# Patient Record
Sex: Female | Born: 1955 | Race: Black or African American | Hispanic: No | Marital: Married | Smoking: Never smoker
Health system: Southern US, Community
[De-identification: ages and names within clinical notes are randomized; demographics above are authoritative.]

## PROBLEM LIST (undated history)

## (undated) DIAGNOSIS — I10 Essential (primary) hypertension: Secondary | ICD-10-CM

## (undated) DIAGNOSIS — M069 Rheumatoid arthritis, unspecified: Secondary | ICD-10-CM

## (undated) DIAGNOSIS — K219 Gastro-esophageal reflux disease without esophagitis: Secondary | ICD-10-CM

## (undated) HISTORY — DX: Rheumatoid arthritis, unspecified: M06.9

## (undated) HISTORY — PX: CHOLECYSTECTOMY: SHX55

## (undated) HISTORY — PX: HEMORROIDECTOMY: SUR656

---

## 2010-12-28 ENCOUNTER — Ambulatory Visit: Payer: Self-pay | Admitting: General Practice

## 2018-12-23 ENCOUNTER — Other Ambulatory Visit: Payer: Self-pay | Admitting: Rheumatology

## 2018-12-23 DIAGNOSIS — G8929 Other chronic pain: Secondary | ICD-10-CM

## 2019-01-06 ENCOUNTER — Ambulatory Visit: Payer: BC Managed Care – PPO

## 2019-02-09 ENCOUNTER — Other Ambulatory Visit (HOSPITAL_COMMUNITY): Payer: Self-pay | Admitting: Rheumatology

## 2019-02-09 ENCOUNTER — Other Ambulatory Visit: Payer: Self-pay | Admitting: Rheumatology

## 2019-02-09 DIAGNOSIS — R9389 Abnormal findings on diagnostic imaging of other specified body structures: Secondary | ICD-10-CM

## 2019-02-12 ENCOUNTER — Other Ambulatory Visit: Payer: Self-pay

## 2019-02-12 ENCOUNTER — Ambulatory Visit
Admission: RE | Admit: 2019-02-12 | Discharge: 2019-02-12 | Disposition: A | Discharge status: Home / Self Care | Payer: BC Managed Care – PPO | Source: Ambulatory Visit | Attending: Rheumatology | Admitting: Rheumatology

## 2019-02-12 ENCOUNTER — Other Ambulatory Visit: Payer: Self-pay | Admitting: Rheumatology

## 2019-02-12 DIAGNOSIS — N281 Cyst of kidney, acquired: Secondary | ICD-10-CM | POA: Diagnosis present

## 2019-02-12 DIAGNOSIS — R9389 Abnormal findings on diagnostic imaging of other specified body structures: Secondary | ICD-10-CM | POA: Diagnosis not present

## 2019-11-30 ENCOUNTER — Ambulatory Visit
Admission: RE | Admit: 2019-11-30 | Discharge: 2019-11-30 | Disposition: A | Discharge status: Home / Self Care | Payer: 59 | Source: Ambulatory Visit | Attending: Family Medicine | Admitting: Family Medicine

## 2019-11-30 ENCOUNTER — Other Ambulatory Visit: Payer: Self-pay | Admitting: Family Medicine

## 2019-11-30 DIAGNOSIS — M25512 Pain in left shoulder: Secondary | ICD-10-CM

## 2019-11-30 DIAGNOSIS — M25532 Pain in left wrist: Secondary | ICD-10-CM | POA: Diagnosis present

## 2021-03-26 ENCOUNTER — Emergency Department (HOSPITAL_COMMUNITY): Payer: 59

## 2021-03-26 ENCOUNTER — Other Ambulatory Visit: Payer: Self-pay

## 2021-03-26 ENCOUNTER — Emergency Department (HOSPITAL_COMMUNITY)
Admission: EM | Admit: 2021-03-26 | Discharge: 2021-03-27 | Disposition: A | Discharge status: Home / Self Care | Payer: 59 | Attending: Emergency Medicine | Admitting: Emergency Medicine

## 2021-03-26 ENCOUNTER — Encounter (HOSPITAL_COMMUNITY): Payer: Self-pay

## 2021-03-26 DIAGNOSIS — M545 Low back pain, unspecified: Secondary | ICD-10-CM | POA: Insufficient documentation

## 2021-03-26 DIAGNOSIS — M25552 Pain in left hip: Secondary | ICD-10-CM | POA: Insufficient documentation

## 2021-03-26 DIAGNOSIS — I10 Essential (primary) hypertension: Secondary | ICD-10-CM | POA: Diagnosis not present

## 2021-03-26 DIAGNOSIS — M25559 Pain in unspecified hip: Secondary | ICD-10-CM

## 2021-03-26 HISTORY — DX: Essential (primary) hypertension: I10

## 2021-03-26 LAB — URINALYSIS, ROUTINE W REFLEX MICROSCOPIC
Bilirubin Urine: NEGATIVE
Glucose, UA: NEGATIVE mg/dL
Hgb urine dipstick: NEGATIVE
Ketones, ur: NEGATIVE mg/dL
Leukocytes,Ua: NEGATIVE
Nitrite: NEGATIVE
Protein, ur: NEGATIVE mg/dL
Specific Gravity, Urine: 1.008 (ref 1.005–1.030)
pH: 7 (ref 5.0–8.0)

## 2021-03-26 MED ORDER — METHOCARBAMOL 500 MG PO TABS: 500.0000 mg | ORAL_TABLET | Freq: Three times a day (TID) | ORAL | 0 refills | Status: DC | PRN

## 2021-03-26 MED ORDER — DEXAMETHASONE SODIUM PHOSPHATE 10 MG/ML IJ SOLN
10.0000 mg | Freq: Once | INTRAMUSCULAR | Status: AC
  Administered 2021-03-26: 10 mg via INTRAMUSCULAR
  Filled 2021-03-26: qty 1

## 2021-03-26 MED ORDER — LIDOCAINE 5 % EX PTCH: 1.0000 | MEDICATED_PATCH | CUTANEOUS | 0 refills | Status: DC

## 2021-03-26 NOTE — Discharge Instructions (Signed)
You were evaluated in the Emergency Department and after careful evaluation, we did not find any emergent condition requiring admission or further testing in the hospital.  Your exam/testing today was overall reassuring.  X-ray was normal and urine sample was normal.  We are providing you with a prescription for a different muscle relaxer to see if it is more helpful.  Also providing numbing patches.  Recommend continued follow-up with your PCP and/or specialist.  Please return to the Emergency Department if you experience any worsening of your condition.  Thank you for allowing [place] to be a part of your care.

## 2021-03-26 NOTE — ED Provider Notes (Signed)
[facility]-EMERGENCY DEPT [facility] Emergency Department Provider Note MRN:  [id number]  Arrival date & time: 03/26/21     Chief Complaint   Back Pain   History of Present Illness   Alyssa Richardson is a 66 y.o. year-old female with a history of hypertension presenting to the ED with chief complaint of back pain.  3 months of pain to the left hip, sometimes in the left lower back, sometimes radiates down the back of the left leg.  Has had injections, has had a prednisone taper, nothing seems to help.  Celecoxib helps but she is not allowed to take it very often because she has a history of GI bleeding.  Denies fever, no numbness or weakness to the arms or legs, no bowel or bladder dysfunction.  Review of Systems  A thorough review of systems was obtained and all systems are negative except as noted in the HPI and PMH.   Patient's Health History    Past Medical History:  Diagnosis Date   Hypertension     Past Surgical History:  Procedure Laterality Date   CHOLECYSTECTOMY     HEMORROIDECTOMY      History reviewed. No pertinent family history.  Social History   Socioeconomic History   Marital status: Married    Spouse name: Not on file   Number of children: Not on file   Years of education: Not on file   Highest education level: Not on file  Occupational History   Not on file  Tobacco Use   Smoking status: Not on file   Smokeless tobacco: Not on file  Substance and Sexual Activity   Alcohol use: Not on file   Drug use: Not on file   Sexual activity: Not on file  Other Topics Concern   Not on file  Social History Narrative   Not on file   Social Determinants of Health   Financial Resource Strain: Not on file  Food Insecurity: Not on file  Transportation Needs: Not on file  Physical Activity: Not on file  Stress: Not on file  Social Connections: Not on file  Intimate Partner Violence: Not on file     Physical Exam   Vitals:   03/26/21 2106  BP: (!) 224/83   Pulse: 68  Resp: 17  Temp: 97.9 F (36.6 C)  SpO2: 97%    CONSTITUTIONAL: Well-appearing, NAD NEURO:  Alert and oriented x 3, normal and symmetric strength and sensation, normal patellar reflexes bilaterally EYES:  eyes equal and reactive ENT/NECK:  no LAD, no JVD CARDIO:   Regular rate, well-perfused, normal S1 and S2 PULM:  CTAB no wheezing or rhonchi GI/GU:  non-distended, non-tender MSK/SPINE:  No gross deformities, no edema SKIN:  no rash, atraumatic   *Additional and/or pertinent findings included in MDM below  Diagnostic and Interventional Summary    EKG Interpretation  Date/Time:    Ventricular Rate:    PR Interval:    QRS Duration:   QT Interval:    QTC Calculation:   R Axis:     Text Interpretation:         Labs Reviewed  URINALYSIS, ROUTINE W REFLEX MICROSCOPIC - Abnormal; Notable for the following components:      Result Value   Color, Urine STRAW (*)    All other components within normal limits    DG Hip Unilat W or Wo Pelvis 2-3 Views Left  Final Result      Medications  dexamethasone (DECADRON) injection 10 mg (10 mg  Intramuscular Given 03/26/21 2346)     Procedures  /  Critical Care Procedures  ED Course and Medical Decision Making  Initial Impression and Ddx Suspect either sciatica versus osteoarthritis of the hip versus bursitis of the hip versus muscle strain or spasm.  Patient has preserved range of motion of the hip, no increased warmth or fever, nothing to suggest septic joint.  No symptoms to suggest myelopathy.  With 3 months of pain and this is becoming more of a chronic pain issue, providing a dose of Decadron, patient also endorsing intermittent left flank pain that seems musculoskeletal, will obtain screening urinalysis.  Anticipating discharge.  Interpretation of Diagnostics X-rays without evidence of fracture or abnormalities, urinalysis is normal.      Patient Reassessment and Ultimate Disposition/Management Patient is  appropriate for discharge with further symptomatic management at home and follow-up.    Complexity of Problems Addressed Chronic illness with exacerbation  Additional Data Reviewed and Analyzed Further history obtained from: Past medical history and medications listed in the EMR  Patient Encounter Risk Assessment Moderate:  Prescriptions  Elmer Sow. Pilar Plate, MD [facility] Emergency [facility] [email or web address]  Final Clinical Impressions(s) / ED Diagnoses     ICD-10-CM   1. Hip pain  M25.559       ED Discharge Orders          Ordered    methocarbamol (ROBAXIN) 500 MG tablet  Every 8 hours PRN        03/26/21 2349    lidocaine (LIDODERM) 5 %  Every 24 hours        03/26/21 2349             Discharge Instructions Discussed with and Provided to Patient:     Discharge Instructions      You were evaluated in the Emergency Department and after careful evaluation, we did not find any emergent condition requiring admission or further testing in the hospital.  Your exam/testing today was overall reassuring.  X-ray was normal and urine sample was normal.  We are providing you with a prescription for a different muscle relaxer to see if it is more helpful.  Also providing numbing patches.  Recommend continued follow-up with your PCP and/or specialist.  Please return to the Emergency Department if you experience any worsening of your condition.  Thank you for allowing [place] to be a part of your care.        Sabas Sous, MD 03/26/21 2350

## 2021-03-26 NOTE — ED Triage Notes (Signed)
Intermittent Left Hip pain that radiates down left leg to left ankle x 3 months. Worse today.

## 2021-03-26 NOTE — ED Triage Notes (Signed)
BP elevated in triage, denies cp or sob. Pt reports she took her bp meds today.

## 2021-09-13 HISTORY — PX: COLONOSCOPY: SHX174

## 2022-08-14 ENCOUNTER — Emergency Department (HOSPITAL_COMMUNITY)
Admission: EM | Admit: 2022-08-14 | Discharge: 2022-08-14 | Disposition: A | Discharge status: Home / Self Care | Payer: 59 | Attending: Emergency Medicine | Admitting: Emergency Medicine

## 2022-08-14 ENCOUNTER — Other Ambulatory Visit: Payer: Self-pay

## 2022-08-14 ENCOUNTER — Emergency Department (HOSPITAL_COMMUNITY): Payer: 59

## 2022-08-14 ENCOUNTER — Encounter (HOSPITAL_COMMUNITY): Payer: Self-pay | Admitting: *Deleted

## 2022-08-14 DIAGNOSIS — Z7982 Long term (current) use of aspirin: Secondary | ICD-10-CM | POA: Insufficient documentation

## 2022-08-14 DIAGNOSIS — R0602 Shortness of breath: Secondary | ICD-10-CM | POA: Insufficient documentation

## 2022-08-14 DIAGNOSIS — R079 Chest pain, unspecified: Secondary | ICD-10-CM

## 2022-08-14 DIAGNOSIS — R072 Precordial pain: Secondary | ICD-10-CM | POA: Diagnosis present

## 2022-08-14 HISTORY — DX: Gastro-esophageal reflux disease without esophagitis: K21.9

## 2022-08-14 LAB — CBC
HCT: 33.9 % — ABNORMAL LOW (ref 36.0–46.0)
Hemoglobin: 11.4 g/dL — ABNORMAL LOW (ref 12.0–15.0)
MCH: 26.5 pg (ref 26.0–34.0)
MCHC: 33.6 g/dL (ref 30.0–36.0)
MCV: 78.7 fL — ABNORMAL LOW (ref 80.0–100.0)
Platelets: 257 10*3/uL (ref 150–400)
RBC: 4.31 MIL/uL (ref 3.87–5.11)
RDW: 14.7 % (ref 11.5–15.5)
WBC: 7.8 10*3/uL (ref 4.0–10.5)
nRBC: 0 % (ref 0.0–0.2)

## 2022-08-14 LAB — BASIC METABOLIC PANEL
Anion gap: 9 (ref 5–15)
BUN: 22 mg/dL (ref 8–23)
CO2: 28 mmol/L (ref 22–32)
Calcium: 9.2 mg/dL (ref 8.9–10.3)
Chloride: 96 mmol/L — ABNORMAL LOW (ref 98–111)
Creatinine, Ser: 0.84 mg/dL (ref 0.44–1.00)
GFR, Estimated: >60 mL/min (ref >60)
Glucose, Bld: 149 mg/dL — ABNORMAL HIGH (ref 70–99)
Potassium: 3.8 mmol/L (ref 3.5–5.1)
Sodium: 133 mmol/L — ABNORMAL LOW (ref 135–145)

## 2022-08-14 LAB — TROPONIN I (HIGH SENSITIVITY)
Troponin I (High Sensitivity): <2 ng/L (ref <18)
Troponin I (High Sensitivity): <2 ng/L (ref <18)

## 2022-08-14 MED ORDER — PANTOPRAZOLE SODIUM 40 MG PO TBEC
40.0000 mg | DELAYED_RELEASE_TABLET | Freq: Every day | ORAL | 0 refills | Status: DC
Start: 2022-08-14 — End: 2023-08-08

## 2022-08-14 MED ORDER — ASPIRIN 81 MG PO CHEW: 81.0000 mg | CHEWABLE_TABLET | Freq: Every day | ORAL | 0 refills | Status: DC

## 2022-08-14 NOTE — Discharge Instructions (Signed)
Follow up with cardiology regarding your chest pain.  Back to the ER for any new or worsening symptoms.  Start taking the Protonix and aspirin daily.

## 2022-08-14 NOTE — ED Triage Notes (Signed)
Pt c/o left side chest pain that has been intermittent for a couple of weeks but states last night the pain lasted for "a while"  Pt states she had an episode of dizziness with the chest pain and describes it as a squeezing pain

## 2022-08-14 NOTE — ED Provider Notes (Signed)
[facility] EMERGENCY DEPARTMENT AT [facility] Provider Note   CSN: [id number] Arrival date & time: 08/14/22  0940     History  Chief Complaint  Patient presents with   Chest Pain    Alyssa Richardson is a 67 y.o. female. Presents the ER complaining of squeezing chest pain yesterday and last night.  States it happens several times throughout the day.  She had episode last night that lasted about an hour.  Is nonradiating.  It was associated with some mild shortness of breath, feeling faint and sweating.  States she was sitting down in her chair having chest pain at that time.  Stood up from her chair and that is when she felt faint and got a little bit sweaty.  This resolved when she sat back down.  No other episodes of dizziness or sweating.  Did not have any nausea with it.    Had no pain since that time.  She has history of high blood pressure, borderline diabetes and has been told that her cholesterol is borderline high, she supposed to control this with her diet. Patient is not a smoker, no drug use.   Chest Pain Pain location:  Substernal area Pain radiates to:  Does not radiate Pain severity:  No pain      Home Medications Prior to Admission medications   Medication Sig Start Date End Date Taking? Authorizing Provider  aspirin 81 MG chewable tablet Chew 1 tablet (81 mg total) by mouth daily. 08/14/22  Yes Jaleena Viviani A, PA-C  pantoprazole (PROTONIX) 40 MG tablet Take 1 tablet (40 mg total) by mouth daily. 08/14/22  Yes Stoney Karczewski A, PA-C  lidocaine (LIDODERM) 5 % Place 1 patch onto the skin daily. Remove & Discard patch within 12 hours or as directed by MD 03/26/21   Sabas Sous, MD  methocarbamol (ROBAXIN) 500 MG tablet Take 1 tablet (500 mg total) by mouth every 8 (eight) hours as needed for muscle spasms. 03/26/21   Sabas Sous, MD      Allergies    Diclofenac sodium, Aspirin, Caffeine, and Ibuprofen    Review of Systems   Review of Systems   Cardiovascular:  Positive for chest pain.    Physical Exam Updated Vital Signs BP (!) 153/67   Pulse (!) 58   Temp 97.8 F (36.6 C) (Oral)   Resp 17   Ht 5\' 1"  (1.549 m)   Wt 85.7 kg   SpO2 98%   BMI 35.71 kg/m  Physical Exam Vitals and nursing note reviewed.  Constitutional:      General: She is not in acute distress.    Appearance: She is well-developed.  HENT:     Head: Normocephalic and atraumatic.  Eyes:     Extraocular Movements: Extraocular movements intact.     Conjunctiva/sclera: Conjunctivae normal.     Pupils: Pupils are equal, round, and reactive to light.  Cardiovascular:     Rate and Rhythm: Normal rate and regular rhythm.     Heart sounds: No murmur heard. Pulmonary:     Effort: Pulmonary effort is normal. No respiratory distress.     Breath sounds: Normal breath sounds.  Abdominal:     Palpations: Abdomen is soft.     Tenderness: There is no abdominal tenderness.  Musculoskeletal:        General: No swelling. Normal range of motion.     Cervical back: Neck supple.     Right lower leg: No tenderness. No edema.  Left lower leg: No tenderness. No edema.  Skin:    General: Skin is warm and dry.     Capillary Refill: Capillary refill takes less than 2 seconds.  Neurological:     General: No focal deficit present.     Mental Status: She is alert.  Psychiatric:        Mood and Affect: Mood normal.     ED Results / Procedures / Treatments   Labs (all labs ordered are listed, but only abnormal results are displayed) Labs Reviewed  BASIC METABOLIC PANEL - Abnormal; Notable for the following components:      Result Value   Sodium 133 (*)    Chloride 96 (*)    Glucose, Bld 149 (*)    All other components within normal limits  CBC - Abnormal; Notable for the following components:   Hemoglobin 11.4 (*)    HCT 33.9 (*)    MCV 78.7 (*)    All other components within normal limits  TROPONIN I (HIGH SENSITIVITY)  TROPONIN I (HIGH SENSITIVITY)     EKG None  Radiology DG Chest Port 1 View  Result Date: 08/14/2022 CLINICAL DATA:  Chest pain. EXAM: PORTABLE CHEST 1 VIEW COMPARISON:  None Available. FINDINGS: The heart size and mediastinal contours are within normal limits. Both lungs are clear. The visualized skeletal structures are unremarkable. IMPRESSION: No active disease. Electronically Signed   By: Lupita Raider M.D.   On: 08/14/2022 10:30    Procedures Procedures    Medications Ordered in ED Medications - No data to display  ED Course/ Medical Decision Making/ A&P                             Medical Decision Making The patient presented today for chest pain that had started yesterday and has now resolved. EKG showed normal sinus rhythm, no ischemic changes, Chest Xray was independently reviewed by me and shows no pulmonary edema or infiltrates. I agree with radiology interpretation.   Symptoms were not present in the ED  I considered a broad differential including but not limited to ACS, PE, Dissection, pneumothorax, costochondritis, pneumonia, GERD, pericarditis, and pericardial effusion.  PE is considered risk, patient does not have any current pain, pain is not pleuritic, no tachycardia or tachypnea, no shortness of breath or pleuritic pain.  Do not feel further testing is needed.  I feel disssection is really unlikely.  Do not feel further testing is needed for this.  Symptoms and EKG are not consistent with pericarditis  Their heart score is 4. Troponins show delta of 0, troponin is <2  Consult to cardiology, spoke with Dr. Tenny Craw.  She feels comfortable with the patient going home and follow-up as an outpatient.  Suggested starting Protonix and aspirin due to risk factors. Is agreeable plan of care and discharge.  Amount and/or Complexity of Data Reviewed Labs: ordered. Radiology: ordered.           Final Clinical Impression(s) / ED Diagnoses Final diagnoses:  Chest pain, unspecified type     Rx / DC Orders ED Discharge Orders          Ordered    Ambulatory referral to Cardiology       Comments: If you have not heard from the Cardiology office within the next 72 hours please call [phone].   08/14/22 1424    pantoprazole (PROTONIX) 40 MG tablet  Daily  08/14/22 1425    aspirin 81 MG chewable tablet  Daily        08/14/22 [address] 08/14/22 1431    Bethann Berkshire, MD 08/15/22 1758

## 2022-09-06 ENCOUNTER — Ambulatory Visit: Payer: 59 | Attending: Internal Medicine | Admitting: Internal Medicine

## 2022-09-06 ENCOUNTER — Encounter: Payer: Self-pay | Admitting: Internal Medicine

## 2022-09-06 VITALS — BP 160/80 | HR 68 | Ht 61.0 in | Wt 190.8 lb

## 2022-09-06 DIAGNOSIS — I10 Essential (primary) hypertension: Secondary | ICD-10-CM | POA: Insufficient documentation

## 2022-09-06 DIAGNOSIS — R0789 Other chest pain: Secondary | ICD-10-CM | POA: Insufficient documentation

## 2022-09-06 MED ORDER — LISINOPRIL-HYDROCHLOROTHIAZIDE 20-12.5 MG PO TABS: 2.0000 | ORAL_TABLET | Freq: Every day | ORAL | 11 refills | Status: DC

## 2022-09-06 NOTE — Progress Notes (Signed)
Cardiology Office Note  Date: 09/06/2022   ID: Alyssa Richardson, DOB April 22, 1955, MRN [id number]  PCP:  [facility]  Cardiologist:  Marjo Bicker, MD Electrophysiologist:  None   Reason for Office Visit: Evaluation of chest pressure at the request of Dr. Estell Harpin   History of Present Illness: Alyssa Richardson is a 67 y.o. female known to have HTN was referred to cardiology clinic for evaluation of chest pressure.  Patient had sporadic episodes of chest pressure since last year lasting for 1 minute but on the recent ER visit in 07/2022, the chest pressure lasted for the whole day, intermittently. EKG showed no ischemia and troponins were within normal limits. No relation with rest or exercise.  Her blood pressures at home are not controlled, ranges between 150 and 160 mmHg. She does not check blood pressures daily at home.  Denies DOE, orthopnea, dizziness, syncope.  Denies smoking cigarettes, alcohol use and illicit drug abuse.  No family history of premature CAD/CAD.  Past Medical History:  Diagnosis Date   Acid reflux    Arthritis, rheumatoid (HCC)    Hypertension     Past Surgical History:  Procedure Laterality Date   CHOLECYSTECTOMY     HEMORROIDECTOMY      Current Outpatient Medications  Medication Sig Dispense Refill   aspirin 81 MG chewable tablet Chew 1 tablet (81 mg total) by mouth daily. 30 tablet 0   lidocaine (LIDODERM) 5 % Place 1 patch onto the skin daily. Remove & Discard patch within 12 hours or as directed by MD 5 patch 0   lisinopril-hydrochlorothiazide (ZESTORETIC) 20-12.5 MG tablet Take 2 tablets by mouth daily. 60 tablet 11   methocarbamol (ROBAXIN) 500 MG tablet Take 1 tablet (500 mg total) by mouth every 8 (eight) hours as needed for muscle spasms. 30 tablet 0   metoprolol succinate (TOPROL-XL) 50 MG 24 hr tablet Take 50 mg by mouth 2 (two) times daily.     pantoprazole (PROTONIX) 40 MG tablet Take 1 tablet (40 mg total) by mouth  daily. 30 tablet 0   No current facility-administered medications for this visit.   Allergies:  Diclofenac sodium, Aspirin, Caffeine, and Ibuprofen   Social History: The patient  reports that she has never smoked. She has never used smokeless tobacco. She reports that she does not currently use alcohol. She reports that she does not currently use drugs.   Family History: The patient's family history is not on file.   ROS:  Please see the history of present illness. Otherwise, complete review of systems is positive for none.  All other systems are reviewed and negative.   Physical Exam: VS:  BP (!) 160/80   Pulse 68   Ht 5\' 1"  (1.549 m)   Wt 190 lb 12.8 oz (86.5 kg)   SpO2 99%   BMI 36.05 kg/m , BMI Body mass index is 36.05 kg/m.  Wt Readings from Last 3 Encounters:  09/06/22 190 lb 12.8 oz (86.5 kg)  08/14/22 189 lb (85.7 kg)  03/26/21 180 lb (81.6 kg)    General: Patient appears comfortable at rest. HEENT: Conjunctiva and lids normal, oropharynx clear with moist mucosa. Neck: Supple, no elevated JVP or carotid bruits, no thyromegaly. Lungs: Clear to auscultation, nonlabored breathing at rest. Cardiac: Regular rate and rhythm, no S3 or significant systolic murmur, no pericardial rub. Abdomen: Soft, nontender, no hepatomegaly, bowel sounds present, no guarding or rebound. Extremities: No pitting edema, distal pulses 2+. Skin: Warm and dry.  Musculoskeletal: No kyphosis. Neuropsychiatric: Alert and oriented x3, affect grossly appropriate.  Recent Labwork: 08/14/2022: BUN 22; Creatinine, Ser 0.84; Hemoglobin 11.4; Platelets 257; Potassium 3.8; Sodium 133  No results found for: "CHOL", "TRIG", "HDL", "CHOLHDL", "VLDL", "LDLCALC", "LDLDIRECT"   Assessment and Plan: Patient is a 67 year old F known to have HTN was referred to cardiology clinic for evaluation of chest pressure.  # Sporadic episodes of chest pressure likely secondary to poorly controlled HTN # Poorly controlled  HTN -Patient's blood pressure at home ranges between 150 and 160 mmHg when she checks randomly. Instructed her to check BPs twice a day, in a.m. and p.m.  Increase lisinopril-HCTZ from 20-12.5 mg to 40-25 mg once daily. Pharmacy does not have 40-25 mg, hence she will be taking 20-12.5 mg 2 tablets daily.  Continue metoprolol succinate 50 mg (will need to switch to carvedilol if blood pressures are inadequately controlled).  If she continue to have chest pressures despite adequate control of blood pressures, she will benefit from NM stress test/exercise Myoview.   I have spent a total of 30 minutes with patient reviewing chart, EKGs, labs and examining patient as well as establishing an assessment and plan that was discussed with the patient.  > 50% of time was spent in direct patient care.    Medication Adjustments/Labs and Tests Ordered: Current medicines are reviewed at length with the patient today.  Concerns regarding medicines are outlined above.   Tests Ordered: No orders of the defined types were placed in this encounter.   Medication Changes: Meds ordered this encounter  Medications   lisinopril-hydrochlorothiazide (ZESTORETIC) 20-12.5 MG tablet    Sig: Take 2 tablets by mouth daily.    Dispense:  60 tablet    Refill:  11    Disposition:  Follow up  6 months  Signed, [name] Verne Spurr, MD, 09/06/2022 12:40 PM     [facility] [address] [place], [place] [address]

## 2022-09-06 NOTE — Patient Instructions (Signed)
Medication Instructions:   Increase Lisinopril -hydrochlorothiazide to 40-25 mg daily   *If you need a refill on your cardiac medications before your next appointment, please call your pharmacy*   Lab Work: NONE   If you have labs (blood work) drawn today and your tests are completely normal, you will receive your results only by: MyChart Message (if you have MyChart) OR A paper copy in the mail If you have any lab test that is abnormal or we need to change your treatment, we will call you to review the results.   Testing/Procedures: NONE    Follow-Up: At [facility], you and your health needs are our priority.  As part of our continuing mission to provide you with exceptional heart care, we have created designated Provider Care Teams.  These Care Teams include your primary Cardiologist (physician) and Advanced Practice Providers (APPs -  Physician Assistants and Nurse Practitioners) who all work together to provide you with the care you need, when you need it.  We recommend signing up for the patient portal called "MyChart".  Sign up information is provided on this After Visit Summary.  MyChart is used to connect with patients for Virtual Visits (Telemedicine).  Patients are able to view lab/test results, encounter notes, upcoming appointments, etc.  Non-urgent messages can be sent to your provider as well.   To learn more about what you can do with MyChart, go to [email or web address].    Your next appointment:   6 month(s)  Provider:   Luane School, MD    Other Instructions Thank you for choosing [facility]!

## 2022-10-01 ENCOUNTER — Encounter: Payer: Self-pay | Admitting: Internal Medicine

## 2022-10-02 ENCOUNTER — Other Ambulatory Visit: Payer: Self-pay | Admitting: *Deleted

## 2022-10-02 MED ORDER — LISINOPRIL-HYDROCHLOROTHIAZIDE 20-12.5 MG PO TABS: 2.0000 | ORAL_TABLET | Freq: Every day | ORAL | 1 refills | Status: AC

## 2022-10-24 ENCOUNTER — Ambulatory Visit: Payer: 59 | Admitting: Internal Medicine

## 2023-02-26 IMAGING — DX DG HIP (WITH OR WITHOUT PELVIS) 2-3V*L*
3 series · 3 of 3 positions shown · non-contrast
Comparison: None.

CLINICAL DATA: Left hip pain that radiates down the left leg.

EXAM:
DG HIP (WITH OR WITHOUT PELVIS) 2-3V LEFT

[pelvis ap]
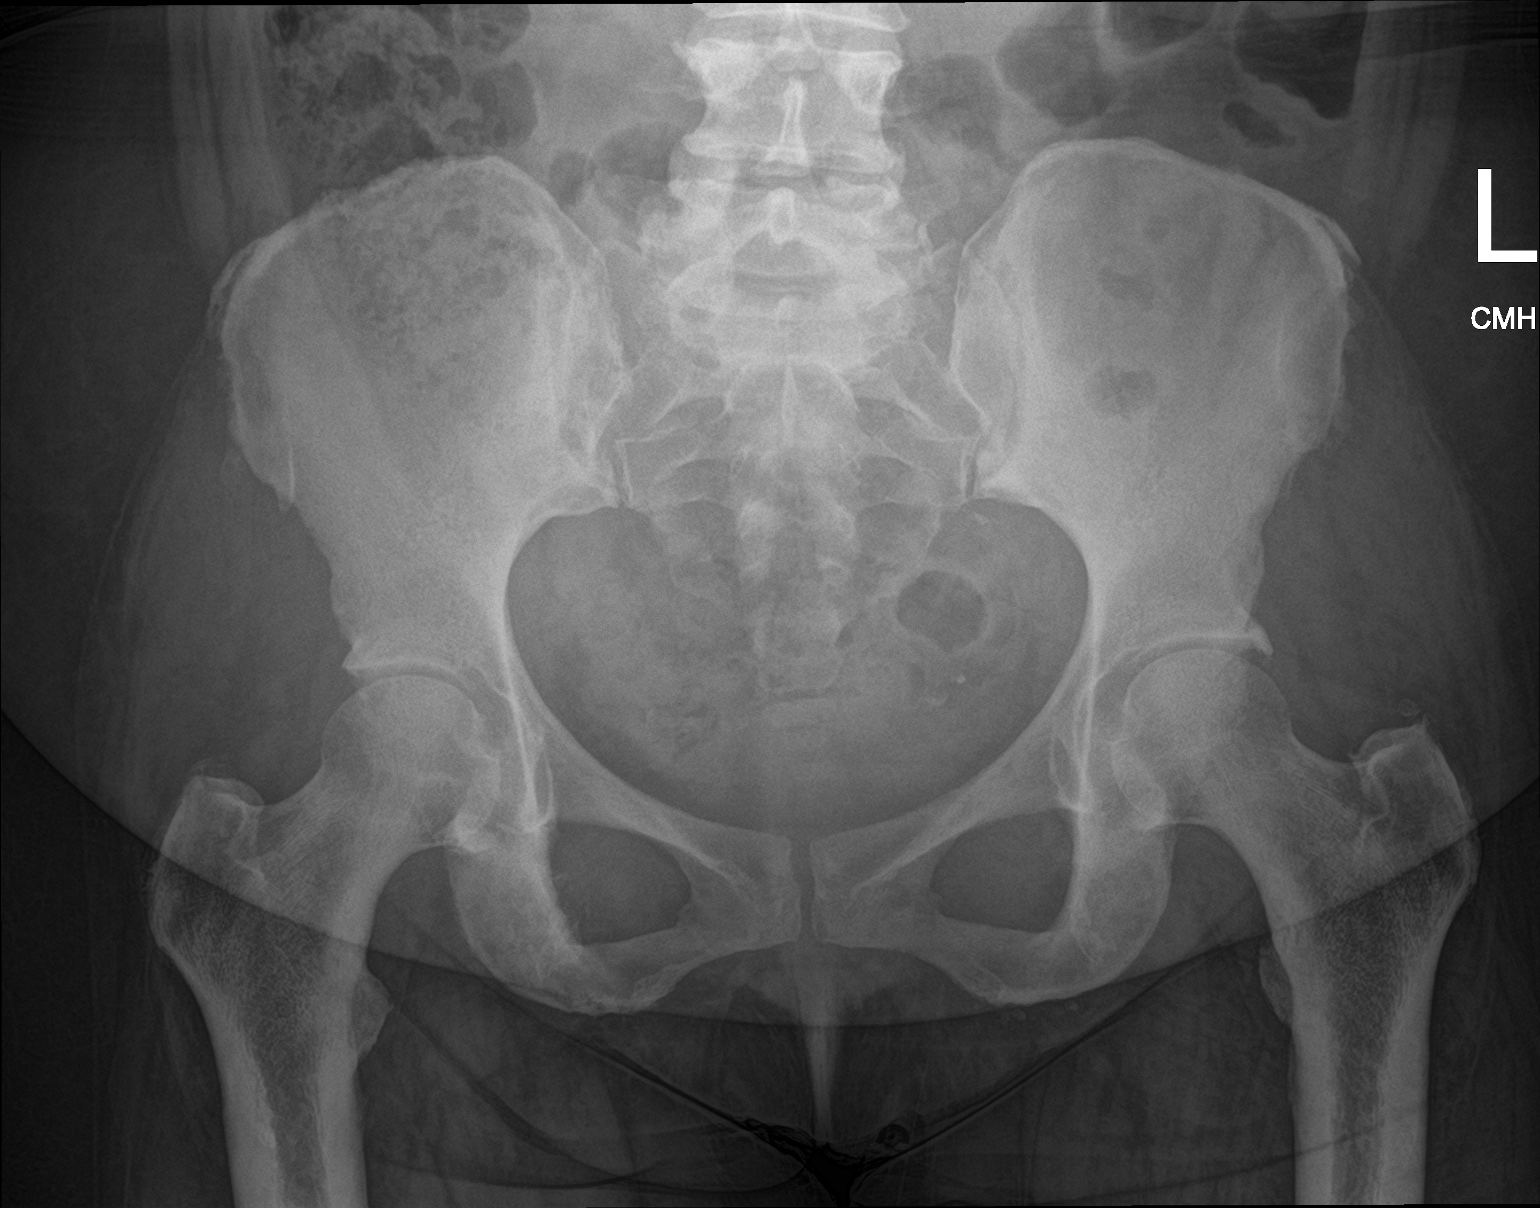

[hip ap]
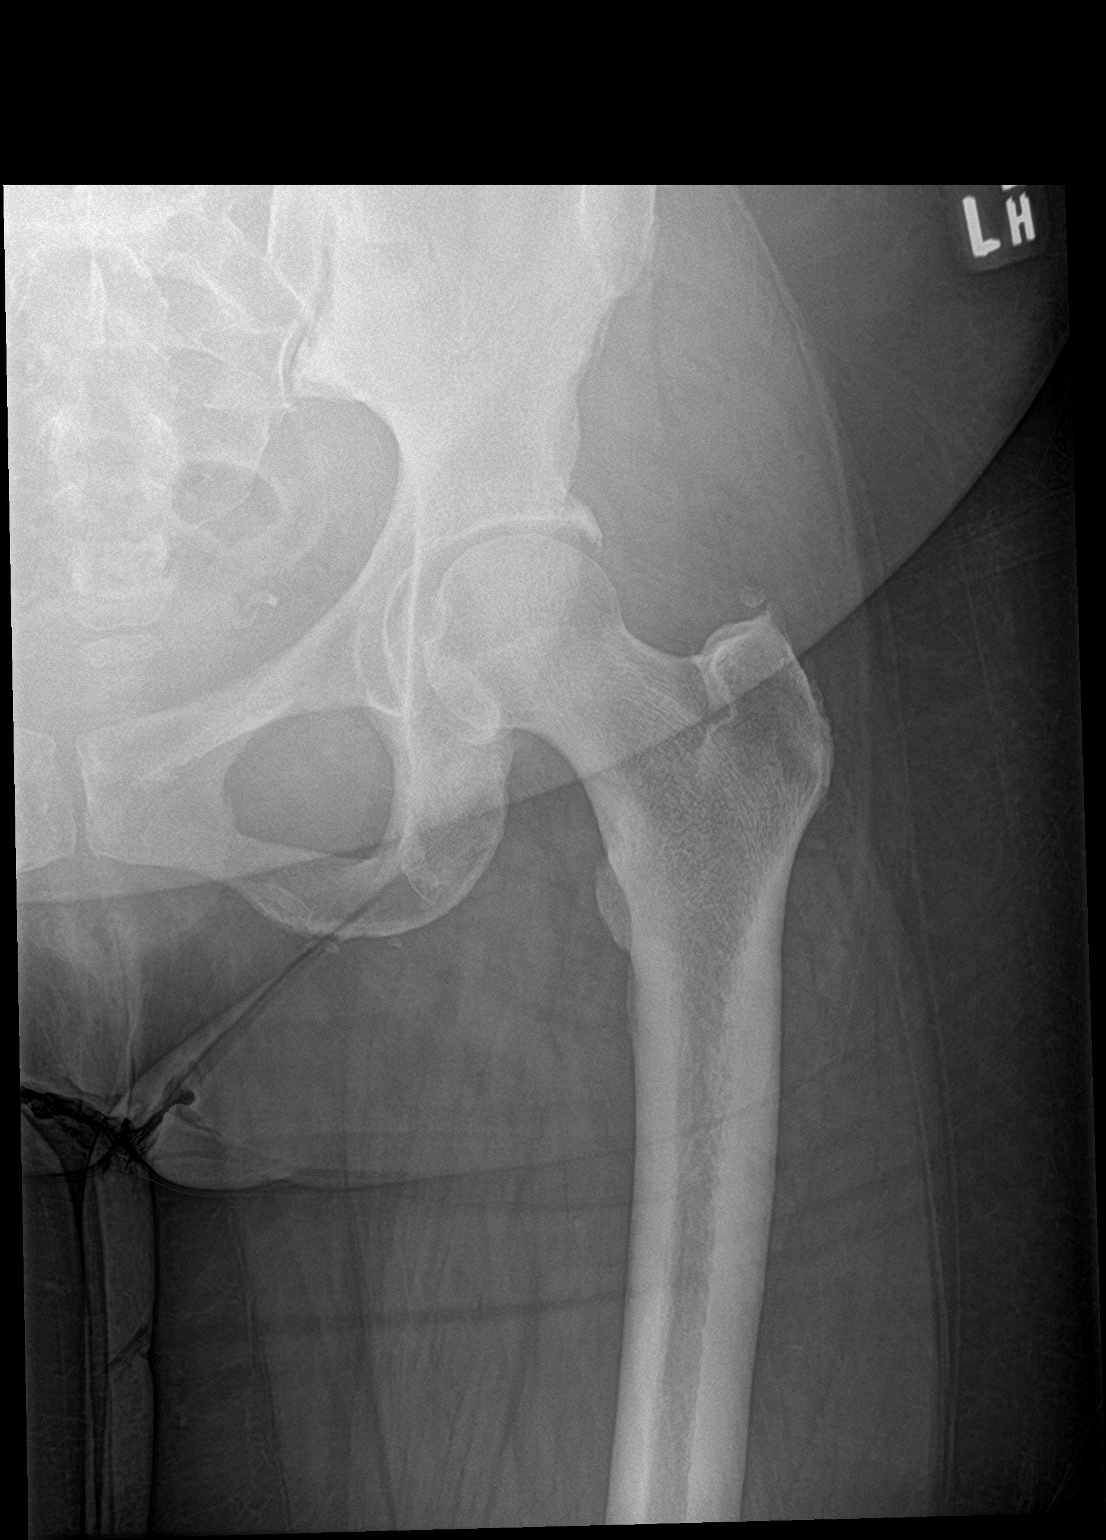

[hip lat]
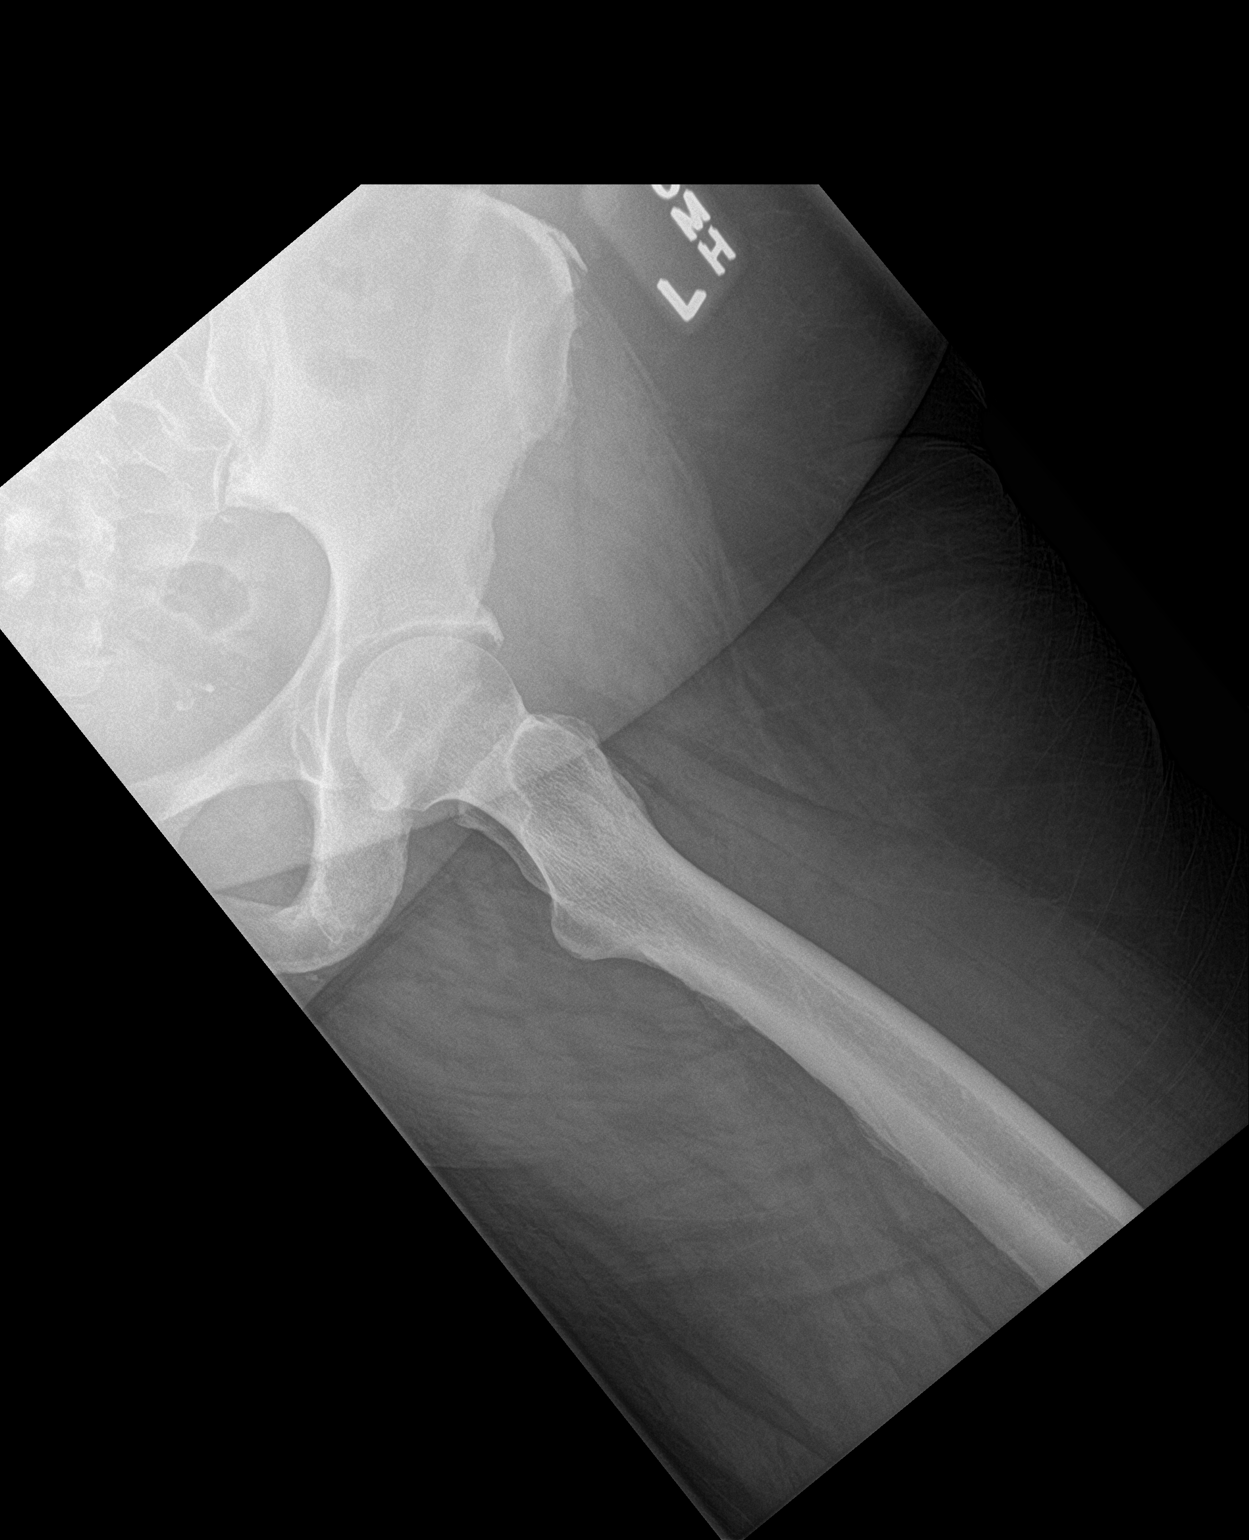

[3 of 3 positions shown; findings below may reference images not displayed]

FINDINGS: There is no evidence of hip fracture or dislocation. There is no
evidence of arthropathy or other focal bone abnormality.
IMPRESSION: Negative.

## 2023-03-13 ENCOUNTER — Ambulatory Visit: Payer: 59 | Admitting: Internal Medicine

## 2023-04-30 ENCOUNTER — Ambulatory Visit: Payer: 59 | Admitting: Student

## 2023-06-25 ENCOUNTER — Ambulatory Visit: Payer: 59 | Admitting: Physician Assistant

## 2023-06-25 NOTE — Progress Notes (Deleted)
  Cardiology Office Note:  .   Date:  06/25/2023  ID:  Alyssa Richardson, DOB Sep 14, 1955, MRN [id number] PCP: [facility]  [facility] Providers Cardiologist:  Marjo Bicker, MD { Click to update primary MD,subspecialty MD or APP then REFRESH:1}   History of Present Illness: .   Alyssa Richardson is a 68 y.o. female with PMH of HTN, GERD, and RA who present for 6 m/o follow up. During hospitalization in 07/2022, patient reports chest pain that was determined to be non-cardiac in the setting of negative troponin's, no ischemic changes and no associated symptoms. No further work up at that time.  She last seen in office 09/06/2022 with Dr. Jenene Slicker to establish care and evaluation of chest pressure following recent hospitalization. Per chart review, BP in office 160/80 and noted poorly controlled home BP with SBP 150-160's. Increased dosage of Lisinopril- hydrochlorothiazide 20-12.5 mg to BID and continued metoprolol succinate 50 mg. Denies any cardiac symptoms at this visit.   ROS: ***  Studies Reviewed: .       @MUSEEKG @  EKG 08/14/22 reviewed: NSR, HR 63, QT prolongation 486 ms, nonspecific T wave changes Labs reviewed from [facility]:   *** Risk Assessment/Calculations:   {Does this patient have ATRIAL FIBRILLATION?:[phone]} No BP recorded.  {Refresh Note OR Click here to enter BP  :1}***       Physical Exam:   VS:  There were no vitals taken for this visit.   Wt Readings from Last 3 Encounters:  09/06/22 190 lb 12.8 oz (86.5 kg)  08/14/22 189 lb (85.7 kg)  03/26/21 180 lb (81.6 kg)    GEN: Well nourished, well developed in no acute distress NECK: No JVD; No carotid bruits CARDIAC: ***RRR, no murmurs, rubs, gallops RESPIRATORY:  Clear to auscultation without rales, wheezing or rhonchi  ABDOMEN: Soft, non-tender, non-distended EXTREMITIES:  No edema; No deformity   ASSESSMENT AND PLAN: .   Hypertension  -  BP this am: Home BP reported as  -   BP appears stable, will continue  -  BP not well controlled, switch metoprolol succinate to carvedilol 6.25 -  Request recent labs from [facility]   Hx of Chest pain  - If still having CP, first line CCTA. If no willing to go to [place], can proceed with Lexiscan Myoview     {Are you ordering a CV Procedure (e.g. stress test, cath, DCCV, TEE, etc)?   Press F2        :[id number]}  Dispo: ***  Signed, Basilio Cairo, PA-C

## 2023-07-15 ENCOUNTER — Other Ambulatory Visit (HOSPITAL_COMMUNITY): Payer: Self-pay | Admitting: Family Medicine

## 2023-07-15 DIAGNOSIS — M549 Dorsalgia, unspecified: Secondary | ICD-10-CM

## 2023-07-22 ENCOUNTER — Ambulatory Visit (HOSPITAL_COMMUNITY)
Admission: RE | Admit: 2023-07-22 | Discharge: 2023-07-22 | Disposition: A | Discharge status: Home / Self Care | Source: Ambulatory Visit | Attending: Family Medicine | Admitting: Family Medicine

## 2023-07-22 DIAGNOSIS — Z13820 Encounter for screening for osteoporosis: Secondary | ICD-10-CM | POA: Diagnosis present

## 2023-07-22 DIAGNOSIS — M549 Dorsalgia, unspecified: Secondary | ICD-10-CM | POA: Diagnosis present

## 2023-07-22 DIAGNOSIS — Z780 Asymptomatic menopausal state: Secondary | ICD-10-CM | POA: Diagnosis not present

## 2023-07-31 ENCOUNTER — Other Ambulatory Visit (HOSPITAL_COMMUNITY): Payer: Self-pay | Admitting: Family Medicine

## 2023-07-31 DIAGNOSIS — Z1231 Encounter for screening mammogram for malignant neoplasm of breast: Secondary | ICD-10-CM

## 2023-08-07 NOTE — H&P (View-Only) (Signed)
 Referring Provider: Evie Hoff, FNP  Primary Care Physician:  Evie Hoff, FNP Primary Gastroenterologist:  Dr. Riley Cheadle per patient's request.   Chief Complaint  Patient presents with   Dysphagia    Having problems swallowing and digesting food.     HPI:   Alyssa Richardson is a 68 y.o. female presenting today at the request of Evie Hoff, FNP for dysphagia.   Reviewed referral information.  Reasoning state 68 year old female with history of GERD endorsing dysphagia with solids, consult for food getting stuck in esophagus with occasional reports of vomiting.  Per OV note GERD was well controlled on pantoprazole  40 mg daiy.   Today:  Having pill dysphagia with associated pain for the last year, but seems to be getting worse. Not having any issues with foods or liquids. Has a lot of sinus drainage and wonders if this is contributing to her symptoms.  Has daily heartburn despite taking pantoprazole  40 mg daily.  Has been on omeprazole in the past as well.   Some epigastric burning postprandially as well.  No specific food triggers.  Seems to be worse in the morning regardless of what she eats.  No nausea or vomiting.  No brbrp or melena.  No NSAIDs. Not taking aspirin  either as this irritates her abdomen.    EGD in [place] about 30 years ago. States it was normal.   Colonoscopy last year at [facility] in [place], [place] .  States it was normal.  No lower GI concerns.  Past Medical History:  Diagnosis Date   Acid reflux    Arthritis, rheumatoid (HCC)    Hypertension     Past Surgical History:  Procedure Laterality Date   CHOLECYSTECTOMY     HEMORROIDECTOMY      Current Outpatient Medications  Medication Sig Dispense Refill   lidocaine  (LIDODERM ) 5 % Place 1 patch onto the skin daily. Remove & Discard patch within 12 hours or as directed by MD 5 patch 0   lisinopril -hydrochlorothiazide  (ZESTORETIC ) 20-12.5 MG tablet Take 2 tablets by mouth  daily. 180 tablet 1   methocarbamol  (ROBAXIN ) 500 MG tablet Take 1 tablet (500 mg total) by mouth every 8 (eight) hours as needed for muscle spasms. 30 tablet 0   metoprolol succinate (TOPROL-XL) 50 MG 24 hr tablet Take 50 mg by mouth 2 (two) times daily.     pantoprazole  (PROTONIX ) 40 MG tablet Take 1 tablet (40 mg total) by mouth 2 (two) times daily before a meal. 60 tablet 3   aspirin  81 MG chewable tablet Chew 1 tablet (81 mg total) by mouth daily. (Patient not taking: Reported on 08/08/2023) 30 tablet 0   No current facility-administered medications for this visit.    Allergies as of 08/08/2023 - Review Complete 08/08/2023  Allergen Reaction Noted   Diclofenac sodium Other (See Comments), Photosensitivity, Rash, and Swelling 09/24/2019   Aspirin  Nausea Only and Other (See Comments) 06/15/2014   Caffeine  01/12/2015   Ibuprofen Nausea Only and Other (See Comments) 06/15/2014    Family History  Problem Relation Age of Onset   Colon cancer Neg Hx    Esophageal cancer Neg Hx    Stomach cancer Neg Hx     Social History   Socioeconomic History   Marital status: Married    Spouse name: Not on file   Number of children: Not on file   Years of education: Not on file   Highest education level: Not on file  Occupational History  Not on file  Tobacco Use   Smoking status: Never   Smokeless tobacco: Never  Vaping Use   Vaping status: Never Used  Substance and Sexual Activity   Alcohol use: Not Currently   Drug use: Not Currently   Sexual activity: Not Currently  Other Topics Concern   Not on file  Social History Narrative   Not on file   Social Drivers of Health   Financial Resource Strain: Not on file  Food Insecurity: Not on file  Transportation Needs: Not on file  Physical Activity: Not on file  Stress: Not on file  Social Connections: Not on file  Intimate Partner Violence: Not on file    Review of Systems: Gen: Denies any fever, chills, cold or flulike  symptoms, presyncope, syncope. CV: Denies chest pain, heart palpitations. Resp: Denies shortness of breath, cough. GI: See HPI GU : Denies urinary burning, urinary frequency, urinary hesitancy MS: Denies joint pain. Derm: Denies rash. Psych: Denies depression, anxiety. Heme: See HPI  Physical Exam: BP 138/70 (BP Location: Right Arm, Patient Position: Sitting, Cuff Size: Large)   Pulse 69   Temp 97.7 F (36.5 C) (Temporal)   Ht 5\' 1"  (1.549 m)   Wt 191 lb 3.2 oz (86.7 kg)   BMI 36.13 kg/m  General:   Alert and oriented. Pleasant and cooperative. Well-nourished and well-developed.  Head:  Normocephalic and atraumatic. Eyes:  Without icterus, sclera clear and conjunctiva pink.  Ears:  Normal auditory acuity. Lungs:  Clear to auscultation bilaterally. No wheezes, rales, or rhonchi. No distress.  Heart:  S1, S2 present without murmurs appreciated.  Abdomen:  +BS, soft, non-tender and non-distended. No HSM noted. No guarding or rebound. No masses appreciated.  Rectal:  Deferred  Msk:  Symmetrical without gross deformities. Normal posture. Extremities:  Without edema. Neurologic:  Alert and  oriented x4;  grossly normal neurologically. Skin:  Intact without significant lesions or rashes. Psych:  Normal mood and affect.    Assessment:  68 year old female with history of GERD, HTN, rheumatoid arthritis, presenting today for further evaluation of dysphagia at the request of [name], FNP.  Dysphagia: Reports pill dysphagia, pills getting stuck in the upper esophagus with associated pain.  No issues with foods or liquids and denies any regurgitation.  Items will pass with liquids.  Notably, this is in the setting of uncontrolled GERD.  May have esophageal web, ring, stricture, reflux esophagitis, Candida esophagitis.  Needs EGD for further evaluation and therapeutic intervention as appropriate.  GERD: Chronic.  Not adequately managed on pantoprazole  40 mg daily.  Reports previously  being on omeprazole.  Will increase pantoprazole  to 40 mg twice daily.  Epigastric pain: Postprandial without specific food triggers. History of cholecystectomy. Denies NSAIDs.  Symptoms may be secondary to uncontrolled GERD versus gastritis, duodenitis, PUD, H. pylori.  This will be evaluated with EGD.  Will also increase pantoprazole  to 40 mg twice daily.  Colon cancer screening: Reports colonoscopy last year at [facility] in [place], [place] .  Will request records.   Plan:  Proceed with upper endoscopy with propofol by Dr. Riley Cheadle in near future. The risks, benefits, and alternatives have been discussed with the patient in detail. The patient states understanding and desires to proceed.  ASA 2 Increase pantoprazole  to 40 mg twice daily 30 minutes before breakfast and dinner. Follow a GERD diet:  Avoid fried, fatty, greasy, spicy, citrus foods. Avoid caffeine and carbonated beverages. Avoid chocolate. Try eating 4-6 small meals a day rather  than 3 large meals. Do not eat within 3 hours of laying down. Prop head of bed up on wood or bricks to create a 6 inch incline. Dysphagia precautions:  Eat slowly, take small bites, chew thoroughly, drink plenty of liquids throughout meals.  Avoid trough textures All meats should be chopped finely.  If something gets hung in your esophagus and will not come up or go down, proceed to the emergency room.   Continue to avoid NSAIDs.  Request colonoscopy records from  [facility]. Follow-up after EGD.    Shana Daring, PA-C [facility] 08/08/2023

## 2023-08-07 NOTE — Progress Notes (Unsigned)
 Referring Provider: Evie Hoff, FNP  Primary Care Physician:  Evie Hoff, FNP Primary Gastroenterologist:  Dr. Riley Cheadle per patient's request.   Chief Complaint  Patient presents with   Dysphagia    Having problems swallowing and digesting food.     HPI:   Alyssa Richardson is a 68 y.o. female presenting today at the request of Evie Hoff, FNP for dysphagia.   Reviewed referral information.  Reasoning state 68 year old female with history of GERD endorsing dysphagia with solids, consult for food getting stuck in esophagus with occasional reports of vomiting.  Per OV note GERD was well controlled on pantoprazole  40 mg daiy.   Today:  Having pill dysphagia with associated pain for the last year, but seems to be getting worse. Not having any issues with foods or liquids. Has a lot of sinus drainage and wonders if this is contributing to her symptoms.  Has daily heartburn despite taking pantoprazole  40 mg daily.  Has been on omeprazole in the past as well.   Some epigastric burning postprandially as well.  No specific food triggers.  Seems to be worse in the morning regardless of what she eats.  No nausea or vomiting.  No brbrp or melena.  No NSAIDs. Not taking aspirin  either as this irritates her abdomen.    EGD in [place] about 30 years ago. States it was normal.   Colonoscopy last year at [facility] in [place], [place] .  States it was normal.  No lower GI concerns.  Past Medical History:  Diagnosis Date   Acid reflux    Arthritis, rheumatoid (HCC)    Hypertension     Past Surgical History:  Procedure Laterality Date   CHOLECYSTECTOMY     HEMORROIDECTOMY      Current Outpatient Medications  Medication Sig Dispense Refill   lidocaine  (LIDODERM ) 5 % Place 1 patch onto the skin daily. Remove & Discard patch within 12 hours or as directed by MD 5 patch 0   lisinopril -hydrochlorothiazide  (ZESTORETIC ) 20-12.5 MG tablet Take 2 tablets by mouth  daily. 180 tablet 1   methocarbamol  (ROBAXIN ) 500 MG tablet Take 1 tablet (500 mg total) by mouth every 8 (eight) hours as needed for muscle spasms. 30 tablet 0   metoprolol succinate (TOPROL-XL) 50 MG 24 hr tablet Take 50 mg by mouth 2 (two) times daily.     pantoprazole  (PROTONIX ) 40 MG tablet Take 1 tablet (40 mg total) by mouth 2 (two) times daily before a meal. 60 tablet 3   aspirin  81 MG chewable tablet Chew 1 tablet (81 mg total) by mouth daily. (Patient not taking: Reported on 08/08/2023) 30 tablet 0   No current facility-administered medications for this visit.    Allergies as of 08/08/2023 - Review Complete 08/08/2023  Allergen Reaction Noted   Diclofenac sodium Other (See Comments), Photosensitivity, Rash, and Swelling 09/24/2019   Aspirin  Nausea Only and Other (See Comments) 06/15/2014   Caffeine  01/12/2015   Ibuprofen Nausea Only and Other (See Comments) 06/15/2014    Family History  Problem Relation Age of Onset   Colon cancer Neg Hx    Esophageal cancer Neg Hx    Stomach cancer Neg Hx     Social History   Socioeconomic History   Marital status: Married    Spouse name: Not on file   Number of children: Not on file   Years of education: Not on file   Highest education level: Not on file  Occupational History  Not on file  Tobacco Use   Smoking status: Never   Smokeless tobacco: Never  Vaping Use   Vaping status: Never Used  Substance and Sexual Activity   Alcohol use: Not Currently   Drug use: Not Currently   Sexual activity: Not Currently  Other Topics Concern   Not on file  Social History Narrative   Not on file   Social Drivers of Health   Financial Resource Strain: Not on file  Food Insecurity: Not on file  Transportation Needs: Not on file  Physical Activity: Not on file  Stress: Not on file  Social Connections: Not on file  Intimate Partner Violence: Not on file    Review of Systems: Gen: Denies any fever, chills, cold or flulike  symptoms, presyncope, syncope. CV: Denies chest pain, heart palpitations. Resp: Denies shortness of breath, cough. GI: See HPI GU : Denies urinary burning, urinary frequency, urinary hesitancy MS: Denies joint pain. Derm: Denies rash. Psych: Denies depression, anxiety. Heme: See HPI  Physical Exam: BP 138/70 (BP Location: Right Arm, Patient Position: Sitting, Cuff Size: Large)   Pulse 69   Temp 97.7 F (36.5 C) (Temporal)   Ht 5\' 1"  (1.549 m)   Wt 191 lb 3.2 oz (86.7 kg)   BMI 36.13 kg/m  General:   Alert and oriented. Pleasant and cooperative. Well-nourished and well-developed.  Head:  Normocephalic and atraumatic. Eyes:  Without icterus, sclera clear and conjunctiva pink.  Ears:  Normal auditory acuity. Lungs:  Clear to auscultation bilaterally. No wheezes, rales, or rhonchi. No distress.  Heart:  S1, S2 present without murmurs appreciated.  Abdomen:  +BS, soft, non-tender and non-distended. No HSM noted. No guarding or rebound. No masses appreciated.  Rectal:  Deferred  Msk:  Symmetrical without gross deformities. Normal posture. Extremities:  Without edema. Neurologic:  Alert and  oriented x4;  grossly normal neurologically. Skin:  Intact without significant lesions or rashes. Psych:  Normal mood and affect.    Assessment:  68 year old female with history of GERD, HTN, rheumatoid arthritis, presenting today for further evaluation of dysphagia at the request of [name], FNP.  Dysphagia: Reports pill dysphagia, pills getting stuck in the upper esophagus with associated pain.  No issues with foods or liquids and denies any regurgitation.  Items will pass with liquids.  Notably, this is in the setting of uncontrolled GERD.  May have esophageal web, ring, stricture, reflux esophagitis, Candida esophagitis.  Needs EGD for further evaluation and therapeutic intervention as appropriate.  GERD: Chronic.  Not adequately managed on pantoprazole  40 mg daily.  Reports previously  being on omeprazole.  Will increase pantoprazole  to 40 mg twice daily.  Epigastric pain: Postprandial without specific food triggers. History of cholecystectomy. Denies NSAIDs.  Symptoms may be secondary to uncontrolled GERD versus gastritis, duodenitis, PUD, H. pylori.  This will be evaluated with EGD.  Will also increase pantoprazole  to 40 mg twice daily.  Colon cancer screening: Reports colonoscopy last year at [facility] in [place], [place] .  Will request records.   Plan:  Proceed with upper endoscopy with propofol by Dr. Riley Cheadle in near future. The risks, benefits, and alternatives have been discussed with the patient in detail. The patient states understanding and desires to proceed.  ASA 2 Increase pantoprazole  to 40 mg twice daily 30 minutes before breakfast and dinner. Follow a GERD diet:  Avoid fried, fatty, greasy, spicy, citrus foods. Avoid caffeine and carbonated beverages. Avoid chocolate. Try eating 4-6 small meals a day rather  than 3 large meals. Do not eat within 3 hours of laying down. Prop head of bed up on wood or bricks to create a 6 inch incline. Dysphagia precautions:  Eat slowly, take small bites, chew thoroughly, drink plenty of liquids throughout meals.  Avoid trough textures All meats should be chopped finely.  If something gets hung in your esophagus and will not come up or go down, proceed to the emergency room.   Continue to avoid NSAIDs.  Request colonoscopy records from  [facility]. Follow-up after EGD.    Shana Daring, PA-C [facility] 08/08/2023

## 2023-08-08 ENCOUNTER — Encounter: Payer: Self-pay | Admitting: *Deleted

## 2023-08-08 ENCOUNTER — Encounter: Payer: Self-pay | Admitting: Gastroenterology

## 2023-08-08 ENCOUNTER — Ambulatory Visit: Admitting: Gastroenterology

## 2023-08-08 VITALS — BP 138/70 | HR 69 | Temp 97.7°F | Ht 61.0 in | Wt 191.2 lb

## 2023-08-08 DIAGNOSIS — R1310 Dysphagia, unspecified: Secondary | ICD-10-CM | POA: Diagnosis not present

## 2023-08-08 DIAGNOSIS — R1013 Epigastric pain: Secondary | ICD-10-CM | POA: Diagnosis not present

## 2023-08-08 DIAGNOSIS — K219 Gastro-esophageal reflux disease without esophagitis: Secondary | ICD-10-CM

## 2023-08-08 MED ORDER — PANTOPRAZOLE SODIUM 40 MG PO TBEC: 40.0000 mg | DELAYED_RELEASE_TABLET | Freq: Two times a day (BID) | ORAL | 3 refills | Status: DC

## 2023-08-08 NOTE — Patient Instructions (Addendum)
 We will get you scheduled for an upper endoscopy with possible stretching of your esophagus with Dr. Riley Cheadle at [facility].   Increase pantoprazole  to 40 mg twice daily 30 minutes before breakfast and dinner.  Follow a GERD diet:  Avoid fried, fatty, greasy, spicy, citrus foods. Avoid caffeine and carbonated beverages. Avoid chocolate. Try eating 4-6 small meals a day rather than 3 large meals. Do not eat within 3 hours of laying down. Prop head of bed up on wood or bricks to create a 6 inch incline.  Swallowing precautions:  Eat slowly, take small bites, chew thoroughly, drink plenty of liquids throughout meals.  Avoid trough textures All meats should be chopped finely.  If something gets hung in your esophagus and will not come up or go down, proceed to the emergency room.     Be sure to avoid all NSAID products including ibuprofen, Aleve, Advil, BC powders, Goody powders, and anything that says "NSAID" on the package.  I will see you back in the office after your procedure.  Do not hesitate to call if you have questions or concerns.  Shana Daring, PA-C [facility]

## 2023-08-14 ENCOUNTER — Ambulatory Visit (HOSPITAL_COMMUNITY)
Admission: RE | Admit: 2023-08-14 | Discharge: 2023-08-14 | Disposition: A | Discharge status: Home / Self Care | Attending: Internal Medicine | Admitting: Internal Medicine

## 2023-08-14 ENCOUNTER — Ambulatory Visit (HOSPITAL_COMMUNITY): Admitting: Certified Registered"

## 2023-08-14 ENCOUNTER — Encounter (HOSPITAL_COMMUNITY): Payer: Self-pay | Admitting: Internal Medicine

## 2023-08-14 ENCOUNTER — Other Ambulatory Visit: Payer: Self-pay

## 2023-08-14 ENCOUNTER — Encounter (HOSPITAL_COMMUNITY)
Admission: RE | Disposition: A | Discharge status: Home / Self Care | Payer: Self-pay | Source: Home / Self Care | Attending: Internal Medicine

## 2023-08-14 DIAGNOSIS — I10 Essential (primary) hypertension: Secondary | ICD-10-CM | POA: Insufficient documentation

## 2023-08-14 DIAGNOSIS — Z1211 Encounter for screening for malignant neoplasm of colon: Secondary | ICD-10-CM | POA: Insufficient documentation

## 2023-08-14 DIAGNOSIS — M069 Rheumatoid arthritis, unspecified: Secondary | ICD-10-CM | POA: Insufficient documentation

## 2023-08-14 DIAGNOSIS — M1990 Unspecified osteoarthritis, unspecified site: Secondary | ICD-10-CM | POA: Diagnosis not present

## 2023-08-14 DIAGNOSIS — R1310 Dysphagia, unspecified: Secondary | ICD-10-CM | POA: Insufficient documentation

## 2023-08-14 DIAGNOSIS — K219 Gastro-esophageal reflux disease without esophagitis: Secondary | ICD-10-CM | POA: Insufficient documentation

## 2023-08-14 DIAGNOSIS — Z79899 Other long term (current) drug therapy: Secondary | ICD-10-CM | POA: Diagnosis not present

## 2023-08-14 DIAGNOSIS — Z7982 Long term (current) use of aspirin: Secondary | ICD-10-CM | POA: Diagnosis not present

## 2023-08-14 HISTORY — PX: ESOPHAGOGASTRODUODENOSCOPY: SHX5428

## 2023-08-14 HISTORY — PX: ESOPHAGEAL DILATION: SHX303

## 2023-08-14 SURGERY — EGD (ESOPHAGOGASTRODUODENOSCOPY)
Anesthesia: General

## 2023-08-14 MED ORDER — PROPOFOL 10 MG/ML IV BOLUS
INTRAVENOUS | Status: DC | PRN
  Administered 2023-08-14: 80 mg via INTRAVENOUS

## 2023-08-14 MED ORDER — LACTATED RINGERS IV SOLN: INTRAVENOUS | Status: DC

## 2023-08-14 MED ORDER — PROPOFOL 500 MG/50ML IV EMUL
INTRAVENOUS | Status: DC | PRN
  Administered 2023-08-14: 125 ug/kg/min via INTRAVENOUS

## 2023-08-14 MED ORDER — LACTATED RINGERS IV SOLN: INTRAVENOUS | Status: DC | PRN

## 2023-08-14 MED ORDER — STERILE WATER FOR IRRIGATION IR SOLN
Status: DC | PRN
  Administered 2023-08-14: 60 mL

## 2023-08-14 MED ORDER — GLYCOPYRROLATE PF 0.2 MG/ML IJ SOSY
PREFILLED_SYRINGE | INTRAMUSCULAR | Status: AC
  Filled 2023-08-14: qty 1

## 2023-08-14 MED ORDER — PROPOFOL 500 MG/50ML IV EMUL
INTRAVENOUS | Status: AC
  Filled 2023-08-14: qty 50

## 2023-08-14 MED ORDER — LIDOCAINE 2% (20 MG/ML) 5 ML SYRINGE
INTRAMUSCULAR | Status: AC
  Filled 2023-08-14: qty 5

## 2023-08-14 MED ORDER — GLYCOPYRROLATE PF 0.2 MG/ML IJ SOSY
PREFILLED_SYRINGE | INTRAMUSCULAR | Status: DC | PRN
  Administered 2023-08-14 (×2): .1 mg via INTRAVENOUS

## 2023-08-14 MED ORDER — LIDOCAINE 2% (20 MG/ML) 5 ML SYRINGE
INTRAMUSCULAR | Status: DC | PRN
  Administered 2023-08-14: 100 mg via INTRAVENOUS

## 2023-08-14 NOTE — Discharge Instructions (Signed)
 EGD Discharge instructions Please read the instructions outlined below and refer to this sheet in the next few weeks. These discharge instructions provide you with general information on caring for yourself after you leave the hospital. Your doctor may also give you specific instructions. While your treatment has been planned according to the most current medical practices available, unavoidable complications occasionally occur. If you have any problems or questions after discharge, please call your doctor. ACTIVITY You may resume your regular activity but move at a slower pace for the next 24 hours.  Take frequent rest periods for the next 24 hours.  Walking will help expel (get rid of) the air and reduce the bloated feeling in your abdomen.  No driving for 24 hours (because of the anesthesia (medicine) used during the test).  You may shower.  Do not sign any important legal documents or operate any machinery for 24 hours (because of the anesthesia used during the test).  NUTRITION Drink plenty of fluids.  You may resume your normal diet.  Begin with a light meal and progress to your normal diet.  Avoid alcoholic beverages for 24 hours or as instructed by your caregiver.  MEDICATIONS You may resume your normal medications unless your caregiver tells you otherwise.  WHAT YOU CAN EXPECT TODAY You may experience abdominal discomfort such as a feeling of fullness or "gas" pains.  FOLLOW-UP Your doctor will discuss the results of your test with you.  SEEK IMMEDIATE MEDICAL ATTENTION IF ANY OF THE FOLLOWING OCCUR: Excessive nausea (feeling sick to your stomach) and/or vomiting.  Severe abdominal pain and distention (swelling).  Trouble swallowing.  Temperature over 101 F (37.8 C).  Rectal bleeding or vomiting of blood.      Your EGD was normal today.  Your esophagus was nicely dilated  Continue taking Protonix  twice daily

## 2023-08-14 NOTE — Anesthesia Postprocedure Evaluation (Signed)
 Anesthesia Post Note  Patient: Genese Quebedeaux  Procedure(s) Performed: EGD (ESOPHAGOGASTRODUODENOSCOPY) DILATION, ESOPHAGUS  Patient location during evaluation: PACU Anesthesia Type: General Level of consciousness: awake and alert Pain management: pain level controlled Vital Signs Assessment: post-procedure vital signs reviewed and stable Respiratory status: spontaneous breathing, nonlabored ventilation, respiratory function stable and patient connected to nasal cannula oxygen Cardiovascular status: blood pressure returned to baseline and stable Postop Assessment: no apparent nausea or vomiting Anesthetic complications: no  No notable events documented.   Last Vitals:  Vitals:   08/14/23 1155 08/14/23 1327  BP: (!) 181/68 (!) 124/98  Pulse: (!) 55 84  Resp: 11 (!) 21  Temp: 36.8 C 36.4 C  SpO2: 100% 96%    Last Pain:  Vitals:   08/14/23 1331  TempSrc:   PainSc: 0-No pain                 Beacher Limerick

## 2023-08-14 NOTE — Anesthesia Preprocedure Evaluation (Addendum)
 Anesthesia Evaluation  Patient identified by MRN, date of birth, ID band Patient awake    Reviewed: Allergy & Precautions, H&P , NPO status , Patient's Chart, lab work & pertinent test results  Airway Mallampati: II  TM Distance: >3 FB Neck ROM: Full    Dental no notable dental hx.    Pulmonary neg pulmonary ROS   Pulmonary exam normal breath sounds clear to auscultation       Cardiovascular hypertension, Normal cardiovascular exam Rhythm:Regular Rate:Normal     Neuro/Psych negative neurological ROS  negative psych ROS   GI/Hepatic Neg liver ROS,GERD  ,,  Endo/Other  negative endocrine ROS    Renal/GU negative Renal ROS  negative genitourinary   Musculoskeletal  (+) Arthritis ,    Abdominal   Peds negative pediatric ROS (+)  Hematology negative hematology ROS (+)   Anesthesia Other Findings   Reproductive/Obstetrics negative OB ROS                             Anesthesia Physical Anesthesia Plan  ASA: 2  Anesthesia Plan: General   Post-op Pain Management:    Induction: Intravenous  PONV Risk Score and Plan:   Airway Management Planned: Nasal Cannula  Additional Equipment:   Intra-op Plan:   Post-operative Plan:   Informed Consent: I have reviewed the patients History and Physical, chart, labs and discussed the procedure including the risks, benefits and alternatives for the proposed anesthesia with the patient or authorized representative who has indicated his/her understanding and acceptance.     Dental advisory given  Plan Discussed with: CRNA  Anesthesia Plan Comments:        Anesthesia Quick Evaluation

## 2023-08-14 NOTE — Op Note (Signed)
 [facility] Patient Name: Alyssa Richardson Procedure Date: 08/14/2023 12:50 PM MRN: [id number] Date of Birth: [birth year] Attending MD: Gemma Kelp , MD, [id number] CSN: [id number] Age: 68 Admit Type: Outpatient Procedure:                Upper GI endoscopy Indications:              Dysphagia Providers:                Gemma Kelp, MD, Troy Furnish. Hazeline Lister RN, RN,                            Alisa App, Annell Barrow Referring MD:              Medicines:                Propofol per Anesthesia Complications:            No immediate complications. Estimated Blood Loss:     Estimated blood loss: none. Procedure:                Pre-Anesthesia Assessment:                           - Prior to the procedure, a History and Physical                            was performed, and patient medications and                            allergies were reviewed. The patient's tolerance of                            previous anesthesia was also reviewed. The risks                            and benefits of the procedure and the sedation                            options and risks were discussed with the patient.                            All questions were answered, and informed consent                            was obtained. Prior Anticoagulants: The patient has                            taken no anticoagulant or antiplatelet agents. ASA                            Grade Assessment: III - A patient with severe                            systemic disease. After reviewing the risks and  benefits, the patient was deemed in satisfactory                            condition to undergo the procedure.                           After obtaining informed consent, the endoscope was                            passed under direct vision. Throughout the                            procedure, the patient's blood pressure, pulse, and                            oxygen  saturations were monitored continuously. The                            [id number]-H190 ([id number]) scope was introduced through the                            mouth, and advanced to the second part of duodenum.                            The upper GI endoscopy was accomplished without                            difficulty. The patient tolerated the procedure                            well. Scope In: 1:14:53 PM Scope Out: 1:20:03 PM Total Procedure Duration: 0 hours 5 minutes 10 seconds  Findings:      The examined esophagus was normal.      The entire examined stomach was normal.      The duodenal bulb and second portion of the duodenum were normal. The       scope was withdrawn. Dilation was performed with a Maloney dilator with       no resistance at 54 Fr. Dilation was performed with a Maloney dilator       with mild resistance at 56 Fr. The dilation site was examined following       endoscope reinsertion and showed no change. Estimated blood loss: none. Impression:               - Normal esophagus. Dilated.                           - Normal stomach.                           - Normal duodenal bulb and second portion of the                            duodenum.                           -  No specimens collected. Moderate Sedation:      Moderate (conscious) sedation was personally administered by an       anesthesia professional. The following parameters were monitored: oxygen       saturation, heart rate, blood pressure, respiratory rate, EKG, adequacy       of pulmonary ventilation, and response to care. Recommendation:           - Patient has a contact number available for                            emergencies. The signs and symptoms of potential                            delayed complications were discussed with the                            patient. Return to normal activities tomorrow.                            Written discharge instructions were provided to the                             patient.                           - Advance diet as tolerated.                           - Continue present medications.                           - Return to my office in 6 weeks. Procedure Code(s):        --- Professional ---                           [id number], Esophagogastroduodenoscopy, flexible,                            transoral; diagnostic, including collection of                            specimen(s) by brushing or washing, when performed                            (separate procedure)                           43450, Dilation of esophagus, by unguided sound or                            bougie, single or multiple passes Diagnosis Code(s):        --- Professional ---                           R13.10, Dysphagia, unspecified CPT copyright 2022 American Medical Association. All rights reserved. The codes documented in this report are preliminary and upon coder  review may  be revised to meet current compliance requirements. Windsor Hatcher. Adalyne Lovick, MD Gemma Kelp, MD 08/14/2023 1:33:31 PM This report has been signed electronically. Number of Addenda: 0

## 2023-08-14 NOTE — Interval H&P Note (Signed)
 History and Physical Interval Note:  08/14/2023 12:53 PM  Alyssa Richardson  has presented today for surgery, with the diagnosis of dysphagia,GERD,epigastric burning.  The various methods of treatment have been discussed with the patient and family. After consideration of risks, benefits and other options for treatment, the patient has consented to  Procedure(s) with comments: EGD (ESOPHAGOGASTRODUODENOSCOPY) (N/A) - 1:45 pm, asa 2 DILATION, ESOPHAGUS (N/A) as a surgical intervention.  The patient's history has been reviewed, patient examined, no change in status, stable for surgery.  I have reviewed the patient's chart and labs.  Questions were answered to the patient's satisfaction.     Alyssa Richardson  No change.  Agree with need for EGD with esophageal dilation as feasible/appropriate per plan. The risks, benefits, limitations, alternatives and imponderables have been reviewed with the patient. Potential for esophageal dilation, biopsy, etc. have also been reviewed.  Questions have been answered. All parties agreeable.

## 2023-08-14 NOTE — Transfer of Care (Signed)
 Immediate Anesthesia Transfer of Care Note  Patient: Alyssa Richardson  Procedure(s) Performed: EGD (ESOPHAGOGASTRODUODENOSCOPY) DILATION, ESOPHAGUS  Patient Location: Endoscopy Unit  Anesthesia Type:General  Level of Consciousness: drowsy and patient cooperative  Airway & Oxygen Therapy: Patient Spontanous Breathing  Post-op Assessment: Report given to RN and Post -op Vital signs reviewed and stable  Post vital signs: Reviewed and stable  Last Vitals:  Vitals Value Taken Time  BP 124/98 08/14/23 1327  Temp 36.4 C 08/14/23 1327  Pulse 84 08/14/23 1327  Resp 21 08/14/23 1327  SpO2 96 % 08/14/23 1327    Last Pain:  Vitals:   08/14/23 1331  TempSrc:   PainSc: 0-No pain      Patients Stated Pain Goal: 9 (08/14/23 1155)  Complications: No notable events documented.

## 2023-08-15 ENCOUNTER — Encounter (HOSPITAL_COMMUNITY): Payer: Self-pay | Admitting: Internal Medicine

## 2023-08-17 ENCOUNTER — Telehealth: Payer: Self-pay | Admitting: Gastroenterology

## 2023-08-17 ENCOUNTER — Encounter: Payer: Self-pay | Admitting: Gastroenterology

## 2023-08-17 NOTE — Telephone Encounter (Signed)
 Received colonoscopy reports from  [facility]  dated 09/13/21 with normal exam. Recommended 10 year repeat exam.   No additional recommendations at this time.

## 2023-08-27 ENCOUNTER — Other Ambulatory Visit: Payer: Self-pay

## 2023-08-27 ENCOUNTER — Emergency Department (HOSPITAL_COMMUNITY)

## 2023-08-27 ENCOUNTER — Encounter (HOSPITAL_COMMUNITY): Payer: Self-pay

## 2023-08-27 ENCOUNTER — Emergency Department (HOSPITAL_COMMUNITY)
Admission: EM | Admit: 2023-08-27 | Discharge: 2023-08-27 | Disposition: A | Discharge status: Home / Self Care | Attending: Emergency Medicine | Admitting: Emergency Medicine

## 2023-08-27 DIAGNOSIS — E876 Hypokalemia: Secondary | ICD-10-CM | POA: Diagnosis not present

## 2023-08-27 DIAGNOSIS — R008 Other abnormalities of heart beat: Secondary | ICD-10-CM | POA: Insufficient documentation

## 2023-08-27 DIAGNOSIS — Z79899 Other long term (current) drug therapy: Secondary | ICD-10-CM | POA: Insufficient documentation

## 2023-08-27 DIAGNOSIS — I10 Essential (primary) hypertension: Secondary | ICD-10-CM | POA: Insufficient documentation

## 2023-08-27 DIAGNOSIS — R079 Chest pain, unspecified: Secondary | ICD-10-CM | POA: Diagnosis present

## 2023-08-27 DIAGNOSIS — I498 Other specified cardiac arrhythmias: Secondary | ICD-10-CM

## 2023-08-27 LAB — COMPREHENSIVE METABOLIC PANEL WITH GFR
ALT: 30 U/L (ref 0–44)
AST: 25 U/L (ref 15–41)
Albumin: 3.5 g/dL (ref 3.5–5.0)
Alkaline Phosphatase: 66 U/L (ref 38–126)
Anion gap: 10 (ref 5–15)
BUN: 23 mg/dL (ref 8–23)
CO2: 23 mmol/L (ref 22–32)
Calcium: 8.7 mg/dL — ABNORMAL LOW (ref 8.9–10.3)
Chloride: 100 mmol/L (ref 98–111)
Creatinine, Ser: 1.05 mg/dL — ABNORMAL HIGH (ref 0.44–1.00)
GFR, Estimated: 59 mL/min — ABNORMAL LOW (ref >60)
Glucose, Bld: 165 mg/dL — ABNORMAL HIGH (ref 70–99)
Potassium: 3 mmol/L — ABNORMAL LOW (ref 3.5–5.1)
Sodium: 133 mmol/L — ABNORMAL LOW (ref 135–145)
Total Bilirubin: 0.4 mg/dL (ref 0.0–1.2)
Total Protein: 7.3 g/dL (ref 6.5–8.1)

## 2023-08-27 LAB — CBC WITH DIFFERENTIAL/PLATELET
Abs Immature Granulocytes: 0.01 10*3/uL (ref 0.00–0.07)
Basophils Absolute: 0.1 10*3/uL (ref 0.0–0.1)
Basophils Relative: 1 %
Eosinophils Absolute: 0.1 10*3/uL (ref 0.0–0.5)
Eosinophils Relative: 2 %
HCT: 31.6 % — ABNORMAL LOW (ref 36.0–46.0)
Hemoglobin: 11 g/dL — ABNORMAL LOW (ref 12.0–15.0)
Immature Granulocytes: 0 %
Lymphocytes Relative: 56 %
Lymphs Abs: 4.7 10*3/uL — ABNORMAL HIGH (ref 0.7–4.0)
MCH: 27.2 pg (ref 26.0–34.0)
MCHC: 34.8 g/dL (ref 30.0–36.0)
MCV: 78.2 fL — ABNORMAL LOW (ref 80.0–100.0)
Monocytes Absolute: 0.6 10*3/uL (ref 0.1–1.0)
Monocytes Relative: 8 %
Neutro Abs: 2.7 10*3/uL (ref 1.7–7.7)
Neutrophils Relative %: 33 %
Platelets: 212 10*3/uL (ref 150–400)
RBC: 4.04 MIL/uL (ref 3.87–5.11)
RDW: 14.3 % (ref 11.5–15.5)
WBC: 8.3 10*3/uL (ref 4.0–10.5)
nRBC: 0 % (ref 0.0–0.2)

## 2023-08-27 LAB — TROPONIN I (HIGH SENSITIVITY)
Troponin I (High Sensitivity): <2 ng/L (ref <18)
Troponin I (High Sensitivity): 4 ng/L (ref <18)

## 2023-08-27 LAB — MAGNESIUM: Magnesium: 1.3 mg/dL — ABNORMAL LOW (ref 1.7–2.4)

## 2023-08-27 MED ORDER — MAGNESIUM OXIDE 140 MG PO CAPS
1.0000 | ORAL_CAPSULE | Freq: Every day | ORAL | 0 refills | Status: DC
Start: 2023-08-27 — End: 2023-08-29

## 2023-08-27 MED ORDER — POTASSIUM CHLORIDE CRYS ER 20 MEQ PO TBCR
40.0000 meq | EXTENDED_RELEASE_TABLET | Freq: Once | ORAL | Status: AC
  Administered 2023-08-27: 40 meq via ORAL
  Filled 2023-08-27: qty 2

## 2023-08-27 MED ORDER — POTASSIUM CHLORIDE CRYS ER 20 MEQ PO TBCR: 20.0000 meq | EXTENDED_RELEASE_TABLET | Freq: Every day | ORAL | 0 refills | Status: DC

## 2023-08-27 MED ORDER — POTASSIUM CHLORIDE 10 MEQ/100ML IV SOLN
10.0000 meq | Freq: Once | INTRAVENOUS | Status: AC
  Administered 2023-08-27: 10 meq via INTRAVENOUS
  Filled 2023-08-27: qty 100

## 2023-08-27 MED ORDER — MAGNESIUM SULFATE 2 GM/50ML IV SOLN
2.0000 g | Freq: Once | INTRAVENOUS | Status: AC
  Administered 2023-08-27: 2 g via INTRAVENOUS
  Filled 2023-08-27: qty 50

## 2023-08-27 NOTE — Discharge Instructions (Addendum)
 Foods rich in both potassium and magnesium include leafy green vegetables like spinach, avocados, bananas, sweet potatoes, milk, salmon, tomatoes, broccoli, pumpkin seeds, and black beans.   You will need to get your potassium and magnesium rechecked by your PCP.

## 2023-08-27 NOTE — ED Provider Notes (Signed)
 [facility] AT [facility] Provider Note   CSN: [id number] Arrival date & time: 08/27/23  1842     History  Chief Complaint  Patient presents with   Chest Pain    Alyssa Richardson is a 68 y.o. female.  Pt is a 68 yo female with pmhx significant for htn, gerd, and arthritis.  Pt developed cp about 2 hrs pta.  Pt has had this in the past, but it usually goes away.  It has not gone away.  Cp feels like pressure.  Pt's HR has been low at home.  She has been to the ED for this and has seen cards for the same last year.  Cards added another bp med, but no stress test done.  She does feel like she is going to pass out sometimes when she exerts herself.       Home Medications Prior to Admission medications   Medication Sig Start Date End Date Taking? Authorizing Provider  GNP VITAMIN D3 EXTRA STRENGTH 25 MCG (1000 UT) tablet Take 1,000 Units by mouth daily. 07/17/23  Yes [provider]  Gymnema Sylvestris Leaf POWD Take 1 capsule by mouth daily.   Yes [provider]  lisinopril -hydrochlorothiazide  (ZESTORETIC ) 20-12.5 MG tablet Take 2 tablets by mouth daily. 10/02/22  Yes Mallipeddi, Vishnu P, MD  Magnesium Oxide 140 MG CAPS Take 1 capsule (140 mg total) by mouth daily. 08/27/23  Yes Sueellen Emery, MD  metoprolol succinate (TOPROL-XL) 50 MG 24 hr tablet Take 50 mg by mouth daily.   Yes [provider]  pantoprazole  (PROTONIX ) 40 MG tablet Take 1 tablet (40 mg total) by mouth 2 (two) times daily before a meal. 08/08/23  Yes April Knack, Kristen S, PA-C  potassium chloride SA (KLOR-CON M) 20 MEQ tablet Take 1 tablet (20 mEq total) by mouth daily. 08/27/23  Yes Sueellen Emery, MD  Turmeric (QC TUMERIC COMPLEX PO) Take 1 capsule by mouth daily.   Yes [provider]  albuterol (VENTOLIN HFA) 108 (90 Base) MCG/ACT inhaler Inhale 1 puff into the lungs every 4 (four) hours as needed for shortness of breath or wheezing. Patient not taking:  Reported on 08/27/2023 04/28/23   [provider]      Allergies    Diclofenac sodium, Aspirin , Caffeine, and Ibuprofen    Review of Systems   Review of Systems  Cardiovascular:  Positive for chest pain and palpitations.  All other systems reviewed and are negative.   Physical Exam Updated Vital Signs BP (!) 139/57   Pulse 62   Temp (!) 97.5 F (36.4 C) (Temporal)   Resp 17   Ht 5\' 1"  (1.549 m)   Wt 85.3 kg   SpO2 98%   BMI 35.52 kg/m  Physical Exam Vitals and nursing note reviewed.  Constitutional:      Appearance: She is well-developed.  HENT:     Head: Normocephalic and atraumatic.  Eyes:     Extraocular Movements: Extraocular movements intact.     Pupils: Pupils are equal, round, and reactive to light.  Cardiovascular:     Rate and Rhythm: Normal rate. Rhythm irregular.     Heart sounds: Normal heart sounds.  Pulmonary:     Effort: Pulmonary effort is normal.     Breath sounds: Normal breath sounds.  Abdominal:     General: Bowel sounds are normal.     Palpations: Abdomen is soft.  Musculoskeletal:        General: Normal range of motion.  Cervical back: Normal range of motion and neck supple.  Skin:    General: Skin is warm.     Capillary Refill: Capillary refill takes less than 2 seconds.  Neurological:     General: No focal deficit present.     Mental Status: She is alert and oriented to person, place, and time.  Psychiatric:        Mood and Affect: Mood normal.        Behavior: Behavior normal.     ED Results / Procedures / Treatments   Labs (all labs ordered are listed, but only abnormal results are displayed) Labs Reviewed  CBC WITH DIFFERENTIAL/PLATELET - Abnormal; Notable for the following components:      Result Value   Hemoglobin 11.0 (*)    HCT 31.6 (*)    MCV 78.2 (*)    Lymphs Abs 4.7 (*)    All other components within normal limits  COMPREHENSIVE METABOLIC PANEL WITH GFR - Abnormal; Notable for the following components:    Sodium 133 (*)    Potassium 3.0 (*)    Glucose, Bld 165 (*)    Creatinine, Ser 1.05 (*)    Calcium 8.7 (*)    GFR, Estimated 59 (*)    All other components within normal limits  MAGNESIUM - Abnormal; Notable for the following components:   Magnesium 1.3 (*)    All other components within normal limits  TROPONIN I (HIGH SENSITIVITY)  TROPONIN I (HIGH SENSITIVITY)    EKG EKG Interpretation Date/Time:  Wednesday August 27 2023 22:33:17 EDT Ventricular Rate:  65 PR Interval:  181 QRS Duration:  77 QT Interval:  415 QTC Calculation: 432 R Axis:   69  Text Interpretation: Sinus rhythm normal after k and mg given Confirmed by Sueellen Emery [phone]) on 08/27/2023 10:43:23 PM  Radiology DG Chest 2 View Result Date: 08/27/2023 CLINICAL DATA:  Chest pain. EXAM: CHEST - 2 VIEW COMPARISON:  Aug 14, 2022. FINDINGS: The heart size and mediastinal contours are within normal limits. Both lungs are clear. The visualized skeletal structures are unremarkable. IMPRESSION: No active cardiopulmonary disease. Electronically Signed   By: Rosalene Colon M.D.   On: 08/27/2023 19:34    Procedures Procedures    Medications Ordered in ED Medications  magnesium sulfate IVPB 2 g 50 mL (2 g Intravenous New Bag/Given 08/27/23 2217)  potassium chloride 10 mEq in 100 mL IVPB (0 mEq Intravenous Stopped 08/27/23 2135)  potassium chloride SA (KLOR-CON M) CR tablet 40 mEq (40 mEq Oral Given 08/27/23 1955)    ED Course/ Medical Decision Making/ A&P                                 Medical Decision Making Amount and/or Complexity of Data Reviewed Labs: ordered. Radiology: ordered.  Risk Prescription drug management.   This patient presents to the ED for concern of cp, this involves an extensive number of treatment options, and is a complaint that carries with it a high risk of complications and morbidity.  The differential diagnosis includes cardiac, pulm, gi, bradycardia   Co morbidities that complicate the  patient evaluation  htn, gerd, and arthritis.   Additional history obtained:  Additional history obtained from epic chart review External records from outside source obtained and reviewed including husband   Lab Tests:  I Ordered, and personally interpreted labs.  The pertinent results include:  cbc with hgb low at 11 (stable);  k low at 3; mg low at 1.3; trop nl   Imaging Studies ordered:  I ordered imaging studies including cxr  I independently visualized and interpreted imaging which showed No active cardiopulmonary disease.  I agree with the radiologist interpretation   Cardiac Monitoring:  The patient was maintained on a cardiac monitor.  I personally viewed and interpreted the cardiac monitored which showed an underlying rhythm of: bigeminy at first; now nsr   Medicines ordered and prescription drug management:  I ordered medication including kcl and mg  for sx  Reevaluation of the patient after these medicines showed that the patient improved I have reviewed the patients home medicines and have made adjustments as needed   Test Considered:  ct   Critical Interventions:  K and mg   Consultations Obtained:  I requested consultation with the cardiologist (Dr. Sanjuana Crutch),  and discussed lab and imaging findings as well as pertinent plan - they outpatient f/u; monitor   Problem List / ED Course:  Bigeminy:  resolved after k and mg.  Pt's troponins are normal.  She is feeling better.  She is stable for d/c. Hypokalemia and hypomagnesemia:  replaced   Reevaluation:  After the interventions noted above, I reevaluated the patient and found that they have :improved   Social Determinants of Health:  Lives at home   Dispostion:  After consideration of the diagnostic results and the patients response to treatment, I feel that the patent would benefit from discharge with outpatient f/u.          Final Clinical Impression(s) / ED Diagnoses Final  diagnoses:  Hypokalemia  Hypomagnesemia  Bigeminy  Nonspecific chest pain    Rx / DC Orders ED Discharge Orders          Ordered    Ambulatory referral to Cardiology        08/27/23 2244    potassium chloride SA (KLOR-CON M) 20 MEQ tablet  Daily        08/27/23 2248    Magnesium Oxide 140 MG CAPS  Daily        08/27/23 2248              Sueellen Emery, MD 08/27/23 2248

## 2023-08-27 NOTE — ED Triage Notes (Signed)
 Pt arrived via POV c/o chest pain that began apprx 2hrs PTA. Pt reports she was outside burning some things when the pain began. Pt reports recently her heart rate has been in the 40's and she has been feeling lightheaded and dizzy.

## 2023-08-28 ENCOUNTER — Telehealth: Payer: Self-pay

## 2023-08-28 ENCOUNTER — Other Ambulatory Visit

## 2023-08-28 DIAGNOSIS — R001 Bradycardia, unspecified: Secondary | ICD-10-CM

## 2023-08-28 NOTE — Progress Notes (Unsigned)
 Cardiology Office Note    Date:  08/29/2023  ID:  Alyssa Richardson, DOB [birth year], MRN [id number] Cardiologist: Lasalle Pointer, MD    History of Present Illness:    Alyssa Richardson is a 68 y.o. female with past medical history of hypertension and GERD who presents to the office today for follow-up from a recent Emergency Department visit.   She was last examined by Dr. Mallipeddi in 08/2022 following a recent Emergency Department visit for chest pain and Hs Trops had been normal and EKG without acute ST changes. BP had been elevated and Lisinopril -HCTZ was increased to 40-25 mg daily and she was continued on Toprol-XL 50 mg daily with plans to switch to Coreg in the future if needed. It was recommended that if she continued to have episode of chest pain despite adequate BP control, could arrange for a stress test.  She most recently presented to the ED on 08/27/2023 for evaluation of chest pain which started 2 hours prior to arrival. Reported having episodes of bradycardia at home as well. She was found to be in ventricular bigeminy but this was in the setting of hypokalemia (K+ 3.0) and hypomagnesia (Mg 1.3). Hs Troponin values were negative. By review of notes, bigeminy resolved after electrolyte replacement and she reported feeling back to baseline. Was discharged home and informed to follow-up with Cardiology as an outpatient.  In talking with the patient today, she reports developing palpitations the day of recent ED evaluation and felt a discomfort along her chest and could feel her heart skipping beats at that time. Says that she checked her heart rate at home and it was in 40's which alarmed her.  Symptoms improved while in the ED with correction of her electrolytes. She reports having occasional, brief palpitations since but no persistent symptoms resembling her ED evaluation.  She is active at baseline and denies any recent chest pain or dyspnea on exertion with routine activities. No  specific orthopnea, PND or pitting edema. She only consumes occasional caffeine and no alcohol use. Today is her birthday!  Studies Reviewed:   EKG: EKG is not ordered today. EKG from 08/27/2023 is reviewed and shows NSR, HR 65 with no acute ST changes. EKG from earlier in the day showed normal sinus rhythm with frequent PVC's in a pattern of bigeminy.  Echocardiogram: 09/2020 Summary   1. The left ventricle is normal in size with upper normal wall thickness.    2. The left ventricular systolic function is normal, LVEF is visually  estimated at 55%.    3. The right ventricle is not well visualized but probably normal in size,  with normal systolic function.   Physical Exam:   VS:  BP 132/64   Pulse 74   Ht 5\' 1"  (1.549 m)   Wt 190 lb (86.2 kg)   SpO2 97%   BMI 35.90 kg/m    Wt Readings from Last 3 Encounters:  08/29/23 190 lb (86.2 kg)  08/27/23 188 lb (85.3 kg)  08/14/23 188 lb (85.3 kg)     GEN: Well nourished, well developed female appearing in no acute distress NECK: No JVD; No carotid bruits CARDIAC: RRR, no murmurs, rubs, gallops RESPIRATORY:  Clear to auscultation without rales, wheezing or rhonchi  ABDOMEN: Appears non-distended. No obvious abdominal masses. EXTREMITIES: No clubbing or cyanosis. No pitting edema.  Distal pedal pulses are 2+ bilaterally.   Assessment and Plan:   1. Atypical Chest Pain - Occurred in the setting of frequent PVC's but  symptoms resolved once electrolytes were replaced and she had return of normal sinus rhythm. She denies any recent exertional chest pain or recurrence since.  - Will plan for an echocardiogram for assessment of any structural abnormalities. If she has recurrent chest pain, can arrange for ischemic evaluation with a Coronary CTA.  2. Ventricular Bigeminy/Palpitations - Noted on EKG tracings and telemetry during recent ED evaluation but this was in the setting of electrolyte abnormalities. A 2-week monitor was recommended and  has already been mailed to the patient. We will follow-up on results once available.  - Will arrange for a follow-up echocardiogram for reassessment of any structural abnormalities. Continue Toprol-XL 50 mg daily for now but may need to adjust based off monitor results. Will recheck electrolytes as discussed below.  3. HTN - BP was initially recorded at 160/86, rechecked and improved to 132/64. Continue current medical therapy with Lisinopril -HCTZ 20-12.5 mg two tablets daily and Toprol-XL 50 mg daily.  4. Hypokalemia/Hypomagnesemia - Recent labs on 08/27/2023 showed her K+ was at 3.0 and Mg at 1.3. She received supplementation at that time and remains on PO magnesium and potassium supplementation. Will recheck BMET and Mg within 2 weeks (she will be on a cruise next week for her 50th wedding anniversary).   Signed, Dorma Gash, PA-C

## 2023-08-28 NOTE — Telephone Encounter (Signed)
 14 day zio monitor shipped to pt's home

## 2023-08-28 NOTE — Telephone Encounter (Signed)
-----   Message from Vishnu P Mallipeddi sent at 08/28/2023 12:48 PM EDT ----- Sure I will have this taken care of. Thank you.  Odilia Bennett, can you send her a live two week event monitor. Thank you. Diagnosis: PVCs. ----- Message ----- From: Renelda Carry, MD Sent: 08/27/2023  10:44 PM EDT To: Vishnu P Mallipeddi, MD   Hello Dr. Mallipeddi,   I received a call today from one of the [facility] ED room physicians regarding Ms. Alyssa Richardson. She presented to the ED with intermittent episodes of low HRs at home. Upon arrival she was found to be bigeminy, but per the ED it resolved on its own. Her potassium was 3.0 and was corrected. The ED team is not able to order a ambulatory monitor, but I was wandering if you would be able to schedule outpatient follow up to address this?  Thank you,   Velvet Gibbs, MD MS  [facility] Cardiology [place]

## 2023-08-29 ENCOUNTER — Ambulatory Visit: Attending: Student | Admitting: Student

## 2023-08-29 ENCOUNTER — Encounter: Payer: Self-pay | Admitting: Student

## 2023-08-29 VITALS — BP 132/64 | HR 74 | Ht 61.0 in | Wt 190.0 lb

## 2023-08-29 DIAGNOSIS — E876 Hypokalemia: Secondary | ICD-10-CM

## 2023-08-29 DIAGNOSIS — R002 Palpitations: Secondary | ICD-10-CM

## 2023-08-29 DIAGNOSIS — R0789 Other chest pain: Secondary | ICD-10-CM

## 2023-08-29 DIAGNOSIS — I498 Other specified cardiac arrhythmias: Secondary | ICD-10-CM | POA: Diagnosis not present

## 2023-08-29 DIAGNOSIS — I10 Essential (primary) hypertension: Secondary | ICD-10-CM | POA: Diagnosis not present

## 2023-08-29 NOTE — Patient Instructions (Signed)
 Medication Instructions:   You can take an extra 25mg  of Toprol-XL if needed for palpitations.   *If you need a refill on your cardiac medications before your next appointment, please call your pharmacy*  Lab Work:  Labs at [facility] in 2 weeks (Magnesium and Potassium).  If you have labs (blood work) drawn today and your tests are completely normal, you will receive your results only by: MyChart Message (if you have MyChart) OR A paper copy in the mail If you have any lab test that is abnormal or we need to change your treatment, we will call you to review the results.  Testing/Procedures:  Echocardiogram - Ultrasound of your heart.   Follow-Up: At [facility], you and your health needs are our priority.  As part of our continuing mission to provide you with exceptional heart care, our providers are all part of one team.  This team includes your primary Cardiologist (physician) and Advanced Practice Providers or APPs (Physician Assistants and Nurse Practitioners) who all work together to provide you with the care you need, when you need it.  Your next appointment:   3 month(s)  Provider:   You may see Vishnu P Mallipeddi, MD or one of the following Advanced Practice Providers on your designated Care Team:   [place], PA-C  Scotesia Ellsworth, [place] Theotis Flake, [place]     We recommend signing up for the patient portal called "MyChart".  Sign up information is provided on this After Visit Summary.  MyChart is used to connect with patients for Virtual Visits (Telemedicine).  Patients are able to view lab/test results, encounter notes, upcoming appointments, etc.  Non-urgent messages can be sent to your provider as well.   To learn more about what you can do with MyChart, go to [email or web address].

## 2023-09-23 NOTE — Progress Notes (Deleted)
 Referring Provider: Doroteo Deward BIRCH, FNP Primary Care Physician:  Doroteo Deward BIRCH, FNP Primary GI Physician: Dr. Shaaron  No chief complaint on file.   HPI:   Alyssa Richardson is a 68 y.o. female presenting today for follow-up of dysphagia.  Patient was seen in consultation on 08/08/2023 for dysphagia.  She reported pill dysphagia with associated pain for the last year, but seem to be getting worse.  Also with daily heartburn despite pantoprazole  40 mg daily.  Had been on omeprazole in the past.  Epigastric burning postprandially.  Denied NSAIDs.  She was scheduled for an EGD, pantoprazole  increased to 40 mg twice daily.   EGD 08/14/2023: Normal esophagus s/p empiric dilation with mild resistance at 56 Fr, normal stomach, normal examined duodenum.  Received colonoscopy reports from  [facility]  dated 09/13/21 with normal exam. Recommended 10 year repeat exam.    Today:    Past Medical History:  Diagnosis Date   Acid reflux    Arthritis, rheumatoid (HCC)    Hypertension     Past Surgical History:  Procedure Laterality Date   CHOLECYSTECTOMY     COLONOSCOPY  09/13/2021   [facility]; Normal exam. Repeat in 10 years.   ESOPHAGEAL DILATION N/A 08/14/2023   Procedure: DILATION, ESOPHAGUS;  Surgeon: Shaaron Lamar HERO, MD;  Location: [facility];  Service: Endoscopy;  Laterality: N/A;   ESOPHAGOGASTRODUODENOSCOPY N/A 08/14/2023   Procedure: EGD (ESOPHAGOGASTRODUODENOSCOPY);  Surgeon: Shaaron Lamar HERO, MD;  Location: [facility];  Service: Endoscopy;  Laterality: N/A;  1:45 pm, asa 2   HEMORROIDECTOMY      Current Outpatient Medications  Medication Sig Dispense Refill   albuterol (VENTOLIN HFA) 108 (90 Base) MCG/ACT inhaler Inhale 1 puff into the lungs every 4 (four) hours as needed for shortness of breath or wheezing.     GNP VITAMIN D3 EXTRA STRENGTH 25 MCG (1000 UT) tablet Take 1,000 Units by mouth daily.     Gymnema Sylvestris Leaf POWD Take 1 capsule by  mouth daily.     lisinopril -hydrochlorothiazide  (ZESTORETIC ) 20-12.5 MG tablet Take 2 tablets by mouth daily. 180 tablet 1   magnesium  oxide (MAG-OX) 400 (240 Mg) MG tablet Take 0.5 tablets by mouth daily.     metoprolol succinate (TOPROL-XL) 50 MG 24 hr tablet Take 50 mg by mouth daily.     montelukast (SINGULAIR) 10 MG tablet 1 tablet Orally Once a day for 90 days     pantoprazole  (PROTONIX ) 40 MG tablet Take 1 tablet (40 mg total) by mouth 2 (two) times daily before a meal. 60 tablet 3   potassium chloride  SA (KLOR-CON  M) 20 MEQ tablet Take 1 tablet (20 mEq total) by mouth daily. 14 tablet 0   Turmeric (QC TUMERIC COMPLEX PO) Take 1 capsule by mouth daily.     No current facility-administered medications for this visit.    Allergies as of 09/25/2023 - Review Complete 08/29/2023  Allergen Reaction Noted   Diclofenac sodium Photosensitivity, Swelling, Rash, and Other (See Comments) 09/24/2019   Aspirin  Nausea Only and Other (See Comments) 06/15/2014   Caffeine Other (See Comments) 01/12/2015   Ibuprofen Nausea Only and Other (See Comments) 06/15/2014    Family History  Problem Relation Age of Onset   Colon cancer Neg Hx    Esophageal cancer Neg Hx    Stomach cancer Neg Hx     Social History   Socioeconomic History   Marital status: Married    Spouse name: Not on  file   Number of children: Not on file   Years of education: Not on file   Highest education level: Not on file  Occupational History   Not on file  Tobacco Use   Smoking status: Never   Smokeless tobacco: Never  Vaping Use   Vaping status: Never Used  Substance and Sexual Activity   Alcohol use: Not Currently   Drug use: Not Currently   Sexual activity: Not Currently  Other Topics Concern   Not on file  Social History Narrative   Not on file   Social Drivers of Health   Financial Resource Strain: Not on file  Food Insecurity: Not on file  Transportation Needs: Not on file  Physical Activity: Not on  file  Stress: Not on file  Social Connections: Not on file    Review of Systems: Gen: Denies fever, chills, anorexia. Denies fatigue, weakness, weight loss.  CV: Denies chest pain, palpitations, syncope, peripheral edema, and claudication. Resp: Denies dyspnea at rest, cough, wheezing, coughing up blood, and pleurisy. GI: Denies vomiting blood, jaundice, and fecal incontinence.   Denies dysphagia or odynophagia. Derm: Denies rash, itching, dry skin Psych: Denies depression, anxiety, memory loss, confusion. No homicidal or suicidal ideation.  Heme: Denies bruising, bleeding, and enlarged lymph nodes.  Physical Exam: There were no vitals taken for this visit. General:   Alert and oriented. No distress noted. Pleasant and cooperative.  Head:  Normocephalic and atraumatic. Eyes:  Conjuctiva clear without scleral icterus. Heart:  S1, S2 present without murmurs appreciated. Lungs:  Clear to auscultation bilaterally. No wheezes, rales, or rhonchi. No distress.  Abdomen:  +BS, soft, non-tender and non-distended. No rebound or guarding. No HSM or masses noted. Msk:  Symmetrical without gross deformities. Normal posture. Extremities:  Without edema. Neurologic:  Alert and  oriented x4 Psych:  Normal mood and affect.    Assessment:     Plan:  ***   Josette Centers, PA-C [facility] 09/25/2023

## 2023-09-25 ENCOUNTER — Ambulatory Visit: Admitting: Gastroenterology

## 2023-09-25 ENCOUNTER — Encounter: Payer: Self-pay | Admitting: Gastroenterology

## 2023-10-20 ENCOUNTER — Ambulatory Visit (HOSPITAL_COMMUNITY)
Admission: RE | Admit: 2023-10-20 | Discharge: 2023-10-20 | Disposition: A | Discharge status: Home / Self Care | Source: Ambulatory Visit | Attending: Student | Admitting: Student

## 2023-10-20 ENCOUNTER — Ambulatory Visit: Payer: Self-pay | Admitting: Student

## 2023-10-20 DIAGNOSIS — I351 Nonrheumatic aortic (valve) insufficiency: Secondary | ICD-10-CM | POA: Diagnosis not present

## 2023-10-20 DIAGNOSIS — R002 Palpitations: Secondary | ICD-10-CM | POA: Diagnosis present

## 2023-10-20 DIAGNOSIS — I10 Essential (primary) hypertension: Secondary | ICD-10-CM | POA: Insufficient documentation

## 2023-10-20 DIAGNOSIS — I498 Other specified cardiac arrhythmias: Secondary | ICD-10-CM | POA: Diagnosis present

## 2023-10-20 LAB — ECHOCARDIOGRAM COMPLETE
Area-P 1/2: 3.08 cm2
S' Lateral: 2.3 cm

## 2023-12-03 DIAGNOSIS — R001 Bradycardia, unspecified: Secondary | ICD-10-CM

## 2023-12-05 ENCOUNTER — Ambulatory Visit: Payer: Self-pay | Admitting: Internal Medicine

## 2023-12-11 NOTE — Progress Notes (Unsigned)
 Cardiology Office Note    Date:  12/12/2023  ID:  Alyssa Richardson, DOB 06-05-1955, MRN [id number] Cardiologist: Diannah SHAUNNA Maywood, MD Cardiology APP:  Johnson Laymon HERO, PA-C { :  History of Present Illness:    Alyssa Richardson is a 68 y.o. female with past medical history of HTN, GERD and chest pain who presents to the office today for 43-month follow-up.  She was last examined by myself in 08/2023 following a recent Emergency Department visit for chest pain and bradycardia. Was found to be in ventricular bigeminy and did have hypokalemia and hypomagnesia. At the time of follow-up, she reported that her symptoms had improved and she only reported brief palpitations but no persistent symptoms and no recurrent chest pain. She was continued on Toprol-XL 50 mg daily and a monitor had already been mailed to her and it was recommend to follow-up on the results once available. An echocardiogram was recommended for assessment of any structural abnormalities. Her monitor did result in the interim and showed predominantly normal sinus rhythm with an average heart rate of 65 bpm. She had 1 run of SVT for 4 beats and PAC's and PVC's but less than 1% burden. Echocardiogram showed a preserved EF of 60 to 65% no regional wall motion abnormalities. She did have grade 1 diastolic dysfunction, normal RV function and mild AI.  In talking with the patient today, she reports having worsening shortness of breath when she went on a cruise earlier this summer. Was evaluated by the provider on board the ship and was diagnosed with fluid retention. Says she received IV Lasix  and a 7-day course of PO Lasix . Says this significantly helped with discomfort and shortness of breath she was experiencing at that time.  Denies any significant recurrence since but does feel like she retains fluid at times as her weight will increase by a few pounds overnight. Her PCP did double her HCTZ dose recently. Denies any specific orthopnea, PND  or pitting edema. No exertional chest pain or progressive palpitations. She continues to try to limit her caffeine intake. Says that she consumes fast food several days a week.  Studies Reviewed:   EKG: EKG is not ordered today.  Echocardiogram: 09/2023 IMPRESSIONS     1. Left ventricular ejection fraction, by estimation, is 60 to 65%. The  left ventricle has normal function. Left ventricular endocardial border  not optimally defined to evaluate regional wall motion. Left ventricular  diastolic parameters are consistent  with Grade I diastolic dysfunction (impaired relaxation).   2. Right ventricular systolic function is normal. The right ventricular  size is normal. There is normal pulmonary artery systolic pressure.   3. The mitral valve is normal in structure. No evidence of mitral valve  regurgitation. No evidence of mitral stenosis.   4. The aortic valve is tricuspid. Aortic valve regurgitation is mild. No  aortic stenosis is present.   5. The inferior vena cava is normal in size with greater than 50%  respiratory variability, suggesting right atrial pressure of 3 mmHg.   Comparison(s): No prior Echocardiogram.   Event Monitor: 09/2023   Patch wear time was for 14 days.   Normal sinus rhythm ranging from 49 to 162 bpm with an average HR 65 bpm.   1 run of SVT with aberrancy occurred that lasted for 4 beats.   No evidence of atrial fibrillation/flutter, ventricular arrhythmias high-grade AV block or pauses.   <1% PAC burden and <1% PVC burden.   Patient triggered events correlated with  NSR (58 to 90 bpm), PVC, ventricular bigeminy, ventricular trigeminy.    Physical Exam:   VS:  BP 130/64 (BP Location: Left Arm, Cuff Size: Large)   Pulse 70   Ht 5' 1 (1.549 m)   Wt 193 lb (87.5 kg)   SpO2 99%   BMI 36.47 kg/m    Wt Readings from Last 3 Encounters:  12/12/23 193 lb (87.5 kg)  08/29/23 190 lb (86.2 kg)  08/27/23 188 lb (85.3 kg)     GEN: Well nourished, well  developed female appearing in no acute distress NECK: No JVD; No carotid bruits CARDIAC: RRR, no murmurs, rubs, gallops RESPIRATORY:  Clear to auscultation without rales, wheezing or rhonchi  ABDOMEN: Appears non-distended. No obvious abdominal masses. EXTREMITIES: No clubbing or cyanosis. No pitting edema.  Distal pedal pulses are 2+ bilaterally.   Assessment and Plan:   1. Ventricular bigeminy/Palpitations - Recent monitor showed predominantly normal sinus rhythm with 1 brief episode of SVT and rare PAC's and PVC's representing less than 1% burden. She denies any recent palpitations. Will continue current medical therapy with Toprol-XL 50 mg daily.  2. Essential hypertension - BP is well-controlled at 130/64 during today's visit. Continue current medical therapy with 2 tablets Lisinopril -HCTZ 20-12.5 mg daily and Toprol-XL 50mg  daily.   3. Dyspnea - As noted above, she reports having fluid retention while on her recent cruise. Denies any dietary indiscretion at that time.  Recent echocardiogram as outlined above showed a preserved EF with grade 1 diastolic dysfunction and normal RV function.  Will check a BNP with upcoming labs. Will provide with an Rx to take Lasix  20 mg as needed but encouraged her not to utilize this more than once a week as she is on HCTZ. If having to take Lasix  more frequently, would stop HCTZ. Also reviewed the importance of limiting sodium intake.   4. Hypokalemia/Hypomagnesemia - K+ was at 3.0 and Mg at 1.3 when checked in 08/2023. Follow-up labs were ordered but not obtained. Will recheck BMET and Mg.   Signed, [name], PA-C

## 2023-12-12 ENCOUNTER — Encounter: Payer: Self-pay | Admitting: Student

## 2023-12-12 ENCOUNTER — Ambulatory Visit: Attending: Student | Admitting: Student

## 2023-12-12 VITALS — BP 130/64 | HR 70 | Ht 61.0 in | Wt 193.0 lb

## 2023-12-12 DIAGNOSIS — R002 Palpitations: Secondary | ICD-10-CM

## 2023-12-12 DIAGNOSIS — R0600 Dyspnea, unspecified: Secondary | ICD-10-CM | POA: Diagnosis not present

## 2023-12-12 DIAGNOSIS — I498 Other specified cardiac arrhythmias: Secondary | ICD-10-CM

## 2023-12-12 DIAGNOSIS — I10 Essential (primary) hypertension: Secondary | ICD-10-CM

## 2023-12-12 DIAGNOSIS — E876 Hypokalemia: Secondary | ICD-10-CM

## 2023-12-12 MED ORDER — FUROSEMIDE 20 MG PO TABS: 20.0000 mg | ORAL_TABLET | ORAL | 1 refills | Status: AC | PRN

## 2023-12-12 NOTE — Patient Instructions (Signed)
 Medication Instructions:  Start Lasix  20 mg one time weekly as needed   *If you need a refill on your cardiac medications before your next appointment, please call your pharmacy*  Lab Work: Your physician recommends that you return for lab work. BMET, BNP, Mg  If you have labs (blood work) drawn today and your tests are completely normal, you will receive your results only by: MyChart Message (if you have MyChart) OR A paper copy in the mail If you have any lab test that is abnormal or we need to change your treatment, we will call you to review the results.  Testing/Procedures: NONE   Follow-Up: At [facility], you and your health needs are our priority.  As part of our continuing mission to provide you with exceptional heart care, our providers are all part of one team.  This team includes your primary Cardiologist (physician) and Advanced Practice Providers or APPs (Physician Assistants and Nurse Practitioners) who all work together to provide you with the care you need, when you need it.  Your next appointment:   3-4 month(s)  Provider:   Vishnu Mallipeddi, MD or Laymon Qua, PA-C    We recommend signing up for the patient portal called MyChart.  Sign up information is provided on this After Visit Summary.  MyChart is used to connect with patients for Virtual Visits (Telemedicine).  Patients are able to view lab/test results, encounter notes, upcoming appointments, etc.  Non-urgent messages can be sent to your provider as well.   To learn more about what you can do with MyChart, go to [email or web address].   Other Instructions Thank you for choosing [facility]!

## 2024-03-12 ENCOUNTER — Ambulatory Visit: Admitting: Student

## 2024-05-06 ENCOUNTER — Ambulatory Visit: Admitting: Student
